# Patient Record
Sex: Male | Born: 1940 | Race: White | Hispanic: No | Marital: Married | Smoking: Never smoker
Health system: Southern US, Community
[De-identification: ages and names within clinical notes are randomized; demographics above are authoritative.]

## PROBLEM LIST (undated history)

## (undated) DIAGNOSIS — E785 Hyperlipidemia, unspecified: Secondary | ICD-10-CM

## (undated) DIAGNOSIS — D696 Thrombocytopenia, unspecified: Secondary | ICD-10-CM

## (undated) DIAGNOSIS — I2510 Atherosclerotic heart disease of native coronary artery without angina pectoris: Secondary | ICD-10-CM

## (undated) DIAGNOSIS — M722 Plantar fascial fibromatosis: Secondary | ICD-10-CM

## (undated) DIAGNOSIS — Z8739 Personal history of other diseases of the musculoskeletal system and connective tissue: Secondary | ICD-10-CM

## (undated) DIAGNOSIS — I10 Essential (primary) hypertension: Secondary | ICD-10-CM

## (undated) HISTORY — DX: Atherosclerotic heart disease of native coronary artery without angina pectoris: I25.10

## (undated) HISTORY — DX: Hyperlipidemia, unspecified: E78.5

## (undated) HISTORY — DX: Plantar fascial fibromatosis: M72.2

## (undated) HISTORY — DX: Personal history of other diseases of the musculoskeletal system and connective tissue: Z87.39

## (undated) HISTORY — DX: Essential (primary) hypertension: I10

---

## 1989-03-06 HISTORY — PX: CORONARY ARTERY BYPASS GRAFT: SHX141

## 2004-04-25 ENCOUNTER — Encounter: Payer: Self-pay | Admitting: Cardiology

## 2009-08-16 ENCOUNTER — Encounter: Payer: Self-pay | Admitting: Cardiology

## 2011-06-05 HISTORY — PX: CARDIAC CATHETERIZATION: SHX172

## 2011-06-09 ENCOUNTER — Encounter: Payer: Self-pay | Admitting: *Deleted

## 2011-06-09 DIAGNOSIS — M722 Plantar fascial fibromatosis: Secondary | ICD-10-CM | POA: Insufficient documentation

## 2011-06-09 DIAGNOSIS — Z8739 Personal history of other diseases of the musculoskeletal system and connective tissue: Secondary | ICD-10-CM | POA: Insufficient documentation

## 2011-07-02 ENCOUNTER — Encounter (HOSPITAL_COMMUNITY): Payer: Self-pay | Admitting: Emergency Medicine

## 2011-07-02 ENCOUNTER — Inpatient Hospital Stay (HOSPITAL_COMMUNITY)
Admission: EM | Admit: 2011-07-02 | Discharge: 2011-07-04 | DRG: 247 | Disposition: A | Discharge status: Home / Self Care | Payer: Medicare Other | Attending: Cardiology | Admitting: Cardiology

## 2011-07-02 ENCOUNTER — Emergency Department (HOSPITAL_COMMUNITY): Payer: Medicare Other

## 2011-07-02 DIAGNOSIS — Z951 Presence of aortocoronary bypass graft: Secondary | ICD-10-CM

## 2011-07-02 DIAGNOSIS — I2510 Atherosclerotic heart disease of native coronary artery without angina pectoris: Principal | ICD-10-CM | POA: Diagnosis present

## 2011-07-02 DIAGNOSIS — M722 Plantar fascial fibromatosis: Secondary | ICD-10-CM | POA: Diagnosis present

## 2011-07-02 DIAGNOSIS — I10 Essential (primary) hypertension: Secondary | ICD-10-CM | POA: Diagnosis present

## 2011-07-02 DIAGNOSIS — I200 Unstable angina: Secondary | ICD-10-CM | POA: Diagnosis present

## 2011-07-02 DIAGNOSIS — E785 Hyperlipidemia, unspecified: Secondary | ICD-10-CM | POA: Diagnosis present

## 2011-07-02 DIAGNOSIS — M109 Gout, unspecified: Secondary | ICD-10-CM | POA: Diagnosis present

## 2011-07-02 DIAGNOSIS — Z79899 Other long term (current) drug therapy: Secondary | ICD-10-CM

## 2011-07-02 LAB — CBC
MCH: 33 pg (ref 26.0–34.0)
MCV: 88.2 fL (ref 78.0–100.0)
Platelets: 121 10*3/uL — ABNORMAL LOW (ref 150–400)
RDW: 13.7 % (ref 11.5–15.5)

## 2011-07-02 LAB — POCT I-STAT, CHEM 8
Calcium, Ion: 1.24 mmol/L (ref 1.12–1.32)
Creatinine, Ser: 1.1 mg/dL (ref 0.50–1.35)
Glucose, Bld: 102 mg/dL — ABNORMAL HIGH (ref 70–99)
Hemoglobin: 15 g/dL (ref 13.0–17.0)
TCO2: 26 mmol/L (ref 0–100)

## 2011-07-02 LAB — DIFFERENTIAL
Basophils Absolute: 0 10*3/uL (ref 0.0–0.1)
Basophils Relative: 0 % (ref 0–1)
Eosinophils Absolute: 0.1 10*3/uL (ref 0.0–0.7)
Eosinophils Relative: 1 % (ref 0–5)
Neutrophils Relative %: 61 % (ref 43–77)

## 2011-07-02 LAB — PRO B NATRIURETIC PEPTIDE: Pro B Natriuretic peptide (BNP): 127.9 pg/mL — ABNORMAL HIGH (ref 0–125)

## 2011-07-02 MED ORDER — NITROGLYCERIN 2 % TD OINT
1.0000 [in_us] | TOPICAL_OINTMENT | Freq: Once | TRANSDERMAL | Status: AC
  Administered 2011-07-02: 1 [in_us] via TOPICAL
  Filled 2011-07-02: qty 1

## 2011-07-02 MED ORDER — HEPARIN (PORCINE) IN NACL 100-0.45 UNIT/ML-% IJ SOLN
1200.0000 [IU]/h | INTRAMUSCULAR | Status: DC
  Administered 2011-07-02: 1000 [IU]/h via INTRAVENOUS
  Filled 2011-07-02 (×2): qty 250

## 2011-07-02 MED ORDER — HEPARIN BOLUS VIA INFUSION
4000.0000 [IU] | Freq: Once | INTRAVENOUS | Status: AC
  Administered 2011-07-02: 4000 [IU] via INTRAVENOUS

## 2011-07-02 MED ORDER — CLOPIDOGREL BISULFATE 300 MG PO TABS
600.0000 mg | ORAL_TABLET | Freq: Once | ORAL | Status: AC
  Administered 2011-07-02: 600 mg via ORAL
  Filled 2011-07-02: qty 2

## 2011-07-02 NOTE — ED Provider Notes (Signed)
History     CSN: [id number]  Arrival date & time 07/02/11  2121   First MD Initiated Contact with Patient 07/02/11 2131      Chief Complaint  Patient presents with  . Chest Pain    (Consider location/radiation/quality/duration/timing/severity/associated sxs/prior treatment) Patient is a 71 y.o. male presenting with chest pain. The history is provided by the patient. No language interpreter was used.  Chest Pain The chest pain began 2 days ago. Chest pain occurs intermittently. The chest pain is worsening. The pain is associated with exertion. The severity of the pain is moderate. The quality of the pain is described as pressure-like, similar to previous episodes and heavy. The pain does not radiate. Chest pain is worsened by exertion. Primary symptoms include shortness of breath and nausea. Pertinent negatives for primary symptoms include no fever, no fatigue, no cough, no palpitations, no abdominal pain, no vomiting and no dizziness.  The shortness of breath began 2 days ago. The shortness of breath developed with exertion. The shortness of breath is moderate.  Pertinent negatives for associated symptoms include no diaphoresis, no lower extremity edema, no near-syncope, no numbness, no orthopnea, no paroxysmal nocturnal dyspnea and no weakness. He tried nitroglycerin and aspirin for the symptoms. Risk factors include male gender.  His past medical history is significant for CAD, hyperlipidemia, hypertension and MI.  Procedure history is positive for cardiac catheterization.     Past Medical History  Diagnosis Date  . Coronary artery disease   . Hypertension   . Hyperlipidemia   . Bilateral plantar fasciitis   . H/O: gout     Past Surgical History  Procedure Date  . Coronary artery bypass graft 1991    severe 90% ostial LAD lesion  . Cardiac catheterization     History reviewed. No pertinent family history.  History  Substance Use Topics  . Smoking status: Never Smoker     . Smokeless tobacco: Not on file  . Alcohol Use: Yes     Occassional Use      Review of Systems  Constitutional: Negative for fever, chills, diaphoresis, activity change, appetite change and fatigue.  HENT: Negative for congestion, sore throat, rhinorrhea, neck pain and neck stiffness.   Respiratory: Positive for shortness of breath. Negative for cough.   Cardiovascular: Positive for chest pain. Negative for palpitations, orthopnea and near-syncope.  Gastrointestinal: Positive for nausea. Negative for vomiting and abdominal pain.  Genitourinary: Negative for dysuria, urgency, frequency and flank pain.  Musculoskeletal: Negative for myalgias, back pain and arthralgias.  Neurological: Negative for dizziness, weakness, light-headedness, numbness and headaches.  All other systems reviewed and are negative.    Allergies  Review of patient's allergies indicates no known allergies.  Home Medications   Current Outpatient Rx  Name Route Sig Dispense Refill  . ALLOPURINOL 100 MG PO TABS Oral Take 100 mg by mouth 2 (two) times daily.    . ASPIRIN 81 MG PO TABS Oral Take 81 mg by mouth daily.    Marland Kitchen BENAZEPRIL HCL 10 MG PO TABS Oral Take 10 mg by mouth daily.    Marland Kitchen VITAMIN D3 2000 UNITS PO TABS Oral Take by mouth daily.    . OMEGA-3 FATTY ACIDS 1000 MG PO CAPS Oral Take 1 g by mouth daily.    Marland Kitchen FLAX SEED OIL PO Oral Take by mouth. Taking 1300 daily    . ONE-DAILY MULTI VITAMINS PO TABS Oral Take 1 tablet by mouth daily.    . TURMERIC PO Oral Take  by mouth daily.      BP 132/89  Pulse 70  Resp 16  SpO2 98%  Physical Exam  Nursing note and vitals reviewed. Constitutional: He is oriented to person, place, and time. He appears well-developed and well-nourished.       Uncomfortable in appearance  HENT:  Head: Normocephalic and atraumatic.  Mouth/Throat: Oropharynx is clear and moist. No oropharyngeal exudate.  Eyes: Conjunctivae and EOM are normal. Pupils are equal, round, and reactive  to light.  Neck: Normal range of motion. Neck supple.  Cardiovascular: Normal rate, regular rhythm, normal heart sounds and intact distal pulses.  Exam reveals no gallop and no friction rub.   No murmur heard. Pulmonary/Chest: Effort normal and breath sounds normal. No respiratory distress. He exhibits no tenderness.  Abdominal: Soft. Bowel sounds are normal. There is no tenderness. There is no rebound and no guarding.  Musculoskeletal: Normal range of motion. He exhibits no edema and no tenderness.  Neurological: He is alert and oriented to person, place, and time. No cranial nerve deficit.  Skin: Skin is warm and dry.    ED Course  Procedures (including critical care time)  CRITICAL CARE Performed by: Dayton Bailiff   Total critical care time: 30 min  Critical care time was exclusive of separately billable procedures and treating other patients.  Critical care was necessary to treat or prevent imminent or life-threatening deterioration.  Critical care was time spent personally by me on the following activities: development of treatment plan with patient and/or surrogate as well as nursing, discussions with consultants, evaluation of patient's response to treatment, examination of patient, obtaining history from patient or surrogate, ordering and performing treatments and interventions, ordering and review of laboratory studies, ordering and review of radiographic studies, pulse oximetry and re-evaluation of patient's condition.   Date: 07/02/2011  Rate: 80  Rhythm: normal sinus rhythm, premature ventricular contractions (PVC) and ventricular bigeminy  QRS Axis: normal  Intervals: normal  ST/T Wave abnormalities: st depressions anterolaterally  Conduction Disutrbances:none  Narrative Interpretation:   Old EKG Reviewed: changes noted  Labs Reviewed  CBC - Abnormal; Notable for the following:    MCHC 37.4 (*)    Platelets 121 (*)    All other components within normal limits  PRO  B NATRIURETIC PEPTIDE - Abnormal; Notable for the following:    Pro B Natriuretic peptide (BNP) 127.9 (*)    All other components within normal limits  POCT I-STAT, CHEM 8 - Abnormal; Notable for the following:    Glucose, Bld 102 (*)    All other components within normal limits  DIFFERENTIAL  PROTIME-INR  POCT I-STAT TROPONIN I   Dg Chest Portable 1 View  07/02/2011  *RADIOLOGY REPORT*  Clinical Data: Chest pain, tightness.  PORTABLE CHEST - 1 VIEW  Comparison: None.  Findings: Heart size upper normal to mildly large status post median sternotomy and CABG.  Interstitial prominence is nonspecific, may reflect senescent changes.  No focal consolidation.  No pleural effusion or pneumothorax.  No acute osseous abnormality.  IMPRESSION: Interstitial prominence without focal consolidation.  Original Report Authenticated By: Waneta Martins, M.D.     1. Unstable angina       MDM  Concerning story for UA.  Discussed with cardiology after trop and ecg performed.  No cxr changes.  Will place on heparin and administer plavix and will admit to cardiology service for UA.  Additional etiologies chest pain considered such as PE and dissection however ACS is the most likely  cause given patient hx and presenting sx        Dayton Bailiff, MD 07/02/11 2249

## 2011-07-02 NOTE — ED Notes (Signed)
Xray and family at BS 

## 2011-07-02 NOTE — ED Notes (Signed)
Alert, NAD, calm, interactive, "CP described as discomfort & indigestion has moved from chest to abd", still 1/10, vague and mild, family x2 at [facility].

## 2011-07-02 NOTE — ED Notes (Signed)
Per EMS, patient complaining of shortness of breath for the last two weeks.  Patient began to have chest pain on Friday; describes chest pain as a "tightness" across the cente rof his chest.  Patient still having chest pain today, but states that it is not as severe as Friday.  Patient has had a bypass in the past (patient states that the pain feels similar to his pain before he had the bypass for a 97% blocked artery).  Patient took 324 mg ASA upon EMS arrival; EMS gave patient 1 nitro.  20 GA left AC IV started by EMS.

## 2011-07-03 ENCOUNTER — Encounter (HOSPITAL_COMMUNITY): Payer: Self-pay | Admitting: *Deleted

## 2011-07-03 ENCOUNTER — Encounter (HOSPITAL_COMMUNITY)
Admission: EM | Disposition: A | Discharge status: Home / Self Care | Payer: Self-pay | Source: Home / Self Care | Attending: Cardiology

## 2011-07-03 DIAGNOSIS — I200 Unstable angina: Secondary | ICD-10-CM

## 2011-07-03 DIAGNOSIS — I2510 Atherosclerotic heart disease of native coronary artery without angina pectoris: Secondary | ICD-10-CM

## 2011-07-03 HISTORY — PX: LEFT HEART CATHETERIZATION WITH CORONARY/GRAFT ANGIOGRAM: SHX5450

## 2011-07-03 LAB — CBC
HCT: 45.5 % (ref 39.0–52.0)
HCT: 45.4 % (ref 39.0–52.0)
MCH: 31.2 pg (ref 26.0–34.0)
MCV: 89.9 fL (ref 78.0–100.0)
MCV: 89.9 fL (ref 78.0–100.0)
Platelets: 114 10*3/uL — ABNORMAL LOW (ref 150–400)
RBC: 5.06 MIL/uL (ref 4.22–5.81)
RBC: 5.05 MIL/uL (ref 4.22–5.81)
RDW: 13.9 % (ref 11.5–15.5)
RDW: 13.7 % (ref 11.5–15.5)
WBC: 6.5 10*3/uL (ref 4.0–10.5)
WBC: 6.3 10*3/uL (ref 4.0–10.5)

## 2011-07-03 LAB — BASIC METABOLIC PANEL
BUN: 14 mg/dL (ref 6–23)
Chloride: 104 mEq/L (ref 96–112)
GFR calc Af Amer: >90 mL/min (ref >90)
GFR calc non Af Amer: 82 mL/min — ABNORMAL LOW (ref >90)
Glucose, Bld: 95 mg/dL (ref 70–99)
Potassium: 4 mEq/L (ref 3.5–5.1)
Sodium: 141 mEq/L (ref 135–145)

## 2011-07-03 LAB — CARDIAC PANEL(CRET KIN+CKTOT+MB+TROPI)
CK, MB: 1.8 ng/mL (ref 0.3–4.0)
CK, MB: 1.5 ng/mL (ref 0.3–4.0)
Relative Index: INVALID (ref 0.0–2.5)
Troponin I: <0.3 ng/mL (ref <0.30)
Troponin I: <0.3 ng/mL (ref <0.30)

## 2011-07-03 LAB — LIPID PANEL
Cholesterol: 198 mg/dL (ref 0–200)
Total CHOL/HDL Ratio: 5.4 RATIO
Triglycerides: 205 mg/dL — ABNORMAL HIGH (ref <150)

## 2011-07-03 LAB — POCT ACTIVATED CLOTTING TIME: Activated Clotting Time: 358 seconds

## 2011-07-03 LAB — PROTIME-INR
INR: 0.92 (ref 0.00–1.49)
INR: 0.92 (ref 0.00–1.49)

## 2011-07-03 LAB — HEMOGLOBIN A1C: Mean Plasma Glucose: 97 mg/dL (ref <117)

## 2011-07-03 SURGERY — LEFT HEART CATHETERIZATION WITH CORONARY/GRAFT ANGIOGRAM
Anesthesia: LOCAL

## 2011-07-03 MED ORDER — SODIUM CHLORIDE 0.9 % IV SOLN
INTRAVENOUS | Status: AC
  Administered 2011-07-03: 16:00:00 via INTRAVENOUS

## 2011-07-03 MED ORDER — MORPHINE SULFATE 2 MG/ML IJ SOLN: 1.0000 mg | INTRAMUSCULAR | Status: DC | PRN

## 2011-07-03 MED ORDER — NITROGLYCERIN IN D5W 200-5 MCG/ML-% IV SOLN
5.0000 ug/min | INTRAVENOUS | Status: DC
  Administered 2011-07-03: 5 ug/min via INTRAVENOUS

## 2011-07-03 MED ORDER — ASPIRIN 81 MG PO CHEW
81.0000 mg | CHEWABLE_TABLET | Freq: Every day | ORAL | Status: DC
  Administered 2011-07-04: 81 mg via ORAL
  Filled 2011-07-03: qty 1

## 2011-07-03 MED ORDER — SODIUM CHLORIDE 0.9 % IV SOLN
0.1500 mg/kg/h | INTRAVENOUS | Status: DC
  Filled 2011-07-03: qty 250

## 2011-07-03 MED ORDER — ACETAMINOPHEN 325 MG PO TABS: 650.0000 mg | ORAL_TABLET | ORAL | Status: DC | PRN

## 2011-07-03 MED ORDER — SODIUM CHLORIDE 0.9 % IV SOLN: 1.0000 mL/kg/h | INTRAVENOUS | Status: DC

## 2011-07-03 MED ORDER — NITROGLYCERIN IN D5W 200-5 MCG/ML-% IV SOLN
INTRAVENOUS | Status: AC
  Filled 2011-07-03: qty 250

## 2011-07-03 MED ORDER — SODIUM CHLORIDE 0.9 % IJ SOLN: 3.0000 mL | INTRAMUSCULAR | Status: DC | PRN

## 2011-07-03 MED ORDER — ASPIRIN 81 MG PO CHEW
324.0000 mg | CHEWABLE_TABLET | ORAL | Status: AC
  Administered 2011-07-03: 324 mg via ORAL
  Filled 2011-07-03: qty 4

## 2011-07-03 MED ORDER — LIDOCAINE HCL (PF) 1 % IJ SOLN
INTRAMUSCULAR | Status: AC
  Filled 2011-07-03: qty 30

## 2011-07-03 MED ORDER — MIDAZOLAM HCL 2 MG/2ML IJ SOLN
INTRAMUSCULAR | Status: AC
  Filled 2011-07-03: qty 2

## 2011-07-03 MED ORDER — BENAZEPRIL HCL 10 MG PO TABS
10.0000 mg | ORAL_TABLET | Freq: Every day | ORAL | Status: DC
  Administered 2011-07-03 – 2011-07-04 (×2): 10 mg via ORAL
  Filled 2011-07-03 (×2): qty 1

## 2011-07-03 MED ORDER — CLOPIDOGREL BISULFATE 75 MG PO TABS
75.0000 mg | ORAL_TABLET | Freq: Every day | ORAL | Status: DC
  Administered 2011-07-03 – 2011-07-04 (×2): 75 mg via ORAL
  Filled 2011-07-03 (×3): qty 1

## 2011-07-03 MED ORDER — SODIUM CHLORIDE 0.9 % IJ SOLN: 3.0000 mL | Freq: Two times a day (BID) | INTRAMUSCULAR | Status: DC

## 2011-07-03 MED ORDER — NITROGLYCERIN 0.2 MG/ML ON CALL CATH LAB
INTRAVENOUS | Status: AC
  Filled 2011-07-03: qty 1

## 2011-07-03 MED ORDER — FENTANYL CITRATE 0.05 MG/ML IJ SOLN
INTRAMUSCULAR | Status: AC
  Filled 2011-07-03: qty 2

## 2011-07-03 MED ORDER — BIVALIRUDIN 250 MG IV SOLR
INTRAVENOUS | Status: AC
  Filled 2011-07-03: qty 250

## 2011-07-03 MED ORDER — ASPIRIN EC 325 MG PO TBEC: 325.0000 mg | DELAYED_RELEASE_TABLET | Freq: Every day | ORAL | Status: DC

## 2011-07-03 MED ORDER — ASPIRIN 300 MG RE SUPP
300.0000 mg | RECTAL | Status: DC
  Filled 2011-07-03: qty 1

## 2011-07-03 MED ORDER — ONDANSETRON HCL 4 MG/2ML IJ SOLN: 4.0000 mg | Freq: Four times a day (QID) | INTRAMUSCULAR | Status: DC | PRN

## 2011-07-03 MED ORDER — METOPROLOL TARTRATE 25 MG PO TABS
ORAL_TABLET | ORAL | Status: AC
  Filled 2011-07-03: qty 1

## 2011-07-03 MED ORDER — METOPROLOL TARTRATE 25 MG PO TABS
25.0000 mg | ORAL_TABLET | Freq: Two times a day (BID) | ORAL | Status: DC
  Administered 2011-07-03 – 2011-07-04 (×4): 25 mg via ORAL
  Filled 2011-07-03 (×5): qty 1

## 2011-07-03 MED ORDER — SODIUM CHLORIDE 0.9 % IV SOLN: 250.0000 mL | INTRAVENOUS | Status: DC | PRN

## 2011-07-03 MED ORDER — ALLOPURINOL 100 MG PO TABS
100.0000 mg | ORAL_TABLET | Freq: Two times a day (BID) | ORAL | Status: DC
  Administered 2011-07-03 – 2011-07-04 (×3): 100 mg via ORAL
  Filled 2011-07-03 (×5): qty 1

## 2011-07-03 MED ORDER — HEPARIN (PORCINE) IN NACL 2-0.9 UNIT/ML-% IJ SOLN
INTRAMUSCULAR | Status: AC
  Filled 2011-07-03: qty 2000

## 2011-07-03 MED ORDER — SODIUM CHLORIDE 0.9 % IV SOLN
INTRAVENOUS | Status: DC
  Administered 2011-07-03: 01:00:00 via INTRAVENOUS

## 2011-07-03 MED ORDER — SODIUM CHLORIDE 0.9 % IJ SOLN
3.0000 mL | Freq: Two times a day (BID) | INTRAMUSCULAR | Status: DC
  Administered 2011-07-03: 3 mL via INTRAVENOUS

## 2011-07-03 MED ORDER — NITROGLYCERIN 0.4 MG SL SUBL: 0.4000 mg | SUBLINGUAL_TABLET | SUBLINGUAL | Status: DC | PRN

## 2011-07-03 MED ORDER — ASPIRIN 81 MG PO CHEW: 324.0000 mg | CHEWABLE_TABLET | ORAL | Status: DC

## 2011-07-03 NOTE — Progress Notes (Signed)
ANTICOAGULATION CONSULT NOTE - Follow Up Consult  Pharmacy Consult for heparin Indication: USAP  Labs:  Basename 07/03/11 0635 07/03/11 0031 07/02/11 2208 07/02/11 2150 07/02/11 2136  HGB 15.8 -- 15.0 -- --  HCT 45.5 -- 44.0 -- 43.3  PLT 122* -- -- -- 121*  APTT -- 115* -- -- --  LABPROT -- 12.6 -- 11.8 --  INR -- 0.92 -- 0.85 --  HEPARINUNFRC 0.21* -- -- -- --  CREATININE -- -- 1.10 -- --  CKTOTAL -- 48 -- -- --  CKMB -- 1.8 -- -- --  TROPONINI -- <0.30 -- -- --    Assessment: 71yo male subtherapeutic on heparin with initial dosing for USAP.  Goal of Therapy:  Heparin level 0.3-0.7 units/ml   Plan:  Will increase heparin gtt by ~2 units/kg/hr to 1200 units/hr and check level in 6hr.  Colleen Can PharmD BCPS 07/03/2011,7:39 AM

## 2011-07-03 NOTE — ED Notes (Signed)
Floor 2000 unable to take report

## 2011-07-03 NOTE — Interval H&P Note (Signed)
History and Physical Interval Note:  07/03/2011 10:41 AM  Devon [name]  has presented today for surgery, with the diagnosis of cp  The various methods of treatment have been discussed with the patient. After consideration of risks, benefits and other options for treatment, the patient has consented to  Procedure(s) (LRB): LEFT HEART CATHETERIZATION WITH CORONARY/GRAFT ANGIOGRAM (N/A) as a surgical intervention .  The patients' history has been reviewed, patient examined, no change in status, stable for surgery.  I have reviewed the patients' chart and labs.  Questions were answered to the patient's satisfaction.  Op report reviewed:  Single IMA to the LAD.       Shawnie Pons

## 2011-07-03 NOTE — Progress Notes (Signed)
Subjective:  Patient having mild chest tightness again this am. Will start O2 and IV NTG. EKG shows new anterior T wave changes. Enzymes neg.  Objective:  Vital Signs in the last 24 hours: Temp:  [97.4 F (36.3 C)-97.8 F (36.6 C)] 97.5 F (36.4 C) (04/29 0519) Pulse Rate:  [52-72] 52  (04/29 0519) Resp:  [10-18] 12  (04/29 0519) BP: (118-189)/(69-98) 148/81 mmHg (04/29 0519) SpO2:  [97 %-100 %] 97 % (04/29 0519) Weight:  [86.909 kg (191 lb 9.6 oz)] 86.909 kg (191 lb 9.6 oz) (04/29 0147)  Intake/Output from previous day: 04/28 0701 - 04/29 0700 In: -  Out: 200 [Urine:200] Intake/Output from this shift:       . allopurinol  100 mg Oral BID  . aspirin  324 mg Oral NOW   Or  . aspirin  300 mg Rectal NOW  . aspirin EC  325 mg Oral Daily  . benazepril  10 mg Oral Daily  . clopidogrel  600 mg Oral Once  . clopidogrel  75 mg Oral Q breakfast  . heparin  4,000 Units Intravenous Once  . metoprolol tartrate  25 mg Oral BID  . nitroGLYCERIN  1 inch Topical Once  . nitroGLYCERIN      . sodium chloride  3 mL Intravenous Q12H      . sodium chloride 75 mL/hr at 07/03/11 0054  . heparin 1,200 Units/hr (07/03/11 0743)  . nitroGLYCERIN      Physical Exam: The patient appears to be in mild  distress.  Head and neck exam reveals that the pupils are equal and reactive.  The extraocular movements are full.  There is no scleral icterus.  Mouth and pharynx are benign.  No lymphadenopathy.  No carotid bruits.  The jugular venous pressure is normal.  Thyroid is not enlarged or tender.  Chest is clear to percussion and auscultation.  No rales or rhonchi.  Expansion of the chest is symmetrical.  Heart reveals no abnormal lift or heave.  First and second heart sounds are normal.  There is no murmur gallop rub or click.  The abdomen is soft and nontender.  Bowel sounds are normoactive.  There is no hepatosplenomegaly or mass.  There are no abdominal bruits.  Extremities reveal no  phlebitis or edema.  Pedal pulses are good.  There is no cyanosis or clubbing.  Neurologic exam is normal strength and no lateralizing weakness.  No sensory deficits.  Integument reveals no rash  Lab Results:  Basename 07/03/11 0635 07/02/11 2208 07/02/11 2136  WBC 6.5 -- 8.2  HGB 15.8 15.0 --  PLT 122* -- 121*    Basename 07/02/11 2208  NA 140  K 3.6  CL 108  CO2 --  GLUCOSE 102*  BUN 21  CREATININE 1.10    Basename 07/03/11 0031  TROPONINI <0.30   Hepatic Function Panel No results found for this basename: PROT,ALBUMIN,AST,ALT,ALKPHOS,BILITOT,BILIDIR,IBILI in the last 72 hours  Basename 07/03/11 0635  CHOL 198   No results found for this basename: PROTIME in the last 72 hours  Imaging: Dg Chest Portable 1 View  07/02/2011  *RADIOLOGY REPORT*  Clinical Data: Chest pain, tightness.  PORTABLE CHEST - 1 VIEW  Comparison: None.  Findings: Heart size upper normal to mildly large status post median sternotomy and CABG.  Interstitial prominence is nonspecific, may reflect senescent changes.  No focal consolidation.  No pleural effusion or pneumothorax.  No acute osseous abnormality.  IMPRESSION: Interstitial prominence without focal consolidation.  Original Report  Authenticated By: Waneta Martins, M.D.    Cardiac Studies: EKG shows new anterior T wave abnormalities. Assessment/Plan:  1. Unstable angina pectoris 2  CAD with remote CABG 3. Essential hypertension.  Plan: Cath this am    LOS: 1 day    Cassell Clement 07/03/2011, 7:56 AM

## 2011-07-03 NOTE — Progress Notes (Signed)
ANTICOAGULATION CONSULT NOTE - Initial Consult  Pharmacy Consult for heparin Indication: USAP  No Known Allergies  Patient Measurements: Height: 6\' 2"  (188 cm) Weight: 191 lb 9.6 oz (86.909 kg) IBW/kg (Calculated) : 82.2   Vital Signs: Temp: 97.4 F (36.3 C) (04/29 0147) Temp src: Oral (04/29 0147) BP: 153/76 mmHg (04/29 0147) Pulse Rate: 57  (04/29 0147)  Labs:  Alvira Philips 07/03/11 0031 07/02/11 2208 07/02/11 2150 07/02/11 2136  HGB -- 15.0 -- 16.2  HCT -- 44.0 -- 43.3  PLT -- -- -- 121*  APTT 115* -- -- --  LABPROT 12.6 -- 11.8 --  INR 0.92 -- 0.85 --  HEPARINUNFRC -- -- -- --  CREATININE -- 1.10 -- --  CKTOTAL 48 -- -- --  CKMB 1.8 -- -- --  TROPONINI <0.30 -- -- --   Estimated Creatinine Clearance: 69.5 ml/min (by C-G formula based on Cr of 1.1).  Medical History: Past Medical History  Diagnosis Date  . Coronary artery disease   . Hypertension   . Hyperlipidemia   . Bilateral plantar fasciitis   . H/O: gout     Medications:  Prescriptions prior to admission  Medication Sig Dispense Refill  . allopurinol (ZYLOPRIM) 100 MG tablet Take 100 mg by mouth 2 (two) times daily.      . benazepril (LOTENSIN) 10 MG tablet Take 10 mg by mouth daily.      . Cholecalciferol (VITAMIN D3) 2000 UNITS TABS Take by mouth daily.      . fish oil-omega-3 fatty acids 1000 MG capsule Take 1 g by mouth daily.      . Flaxseed, Linseed, (FLAX SEED OIL PO) Take by mouth. Taking 1300 daily      . Multiple Vitamin (MULTIVITAMIN) tablet Take 1 tablet by mouth daily.      . TURMERIC PO Take by mouth daily.       Scheduled:    . allopurinol  100 mg Oral BID  . aspirin  324 mg Oral NOW   Or  . aspirin  300 mg Rectal NOW  . aspirin EC  325 mg Oral Daily  . benazepril  10 mg Oral Daily  . clopidogrel  600 mg Oral Once  . clopidogrel  75 mg Oral Q breakfast  . heparin  4,000 Units Intravenous Once  . metoprolol tartrate  25 mg Oral BID  . nitroGLYCERIN  1 inch Topical Once  . sodium  chloride  3 mL Intravenous Q12H    Assessment: 71yo male with h/o CABG >28yr ago and normal stress test two years ago c/o CP relieved with NTG, to begin heparin for USAP while awaiting cath for suspected obstructive lesions.  Goal of Therapy:  Heparin level 0.3-0.7 units/ml   Plan:  Heparin was started by EDMD with heparin bolus of 4000 units and gtt at 1000 units/hr; will continue and monitor heparin levels and CBC.  Colleen Can PharmD BCPS 07/03/2011,2:16 AM

## 2011-07-03 NOTE — ED Notes (Signed)
Card MD at BS 

## 2011-07-03 NOTE — Progress Notes (Signed)
   CARE MANAGEMENT NOTE 07/03/2011  Patient:  Devon [name], Devon [name]   Account Number:  [id number]  Date Initiated:  07/03/2011  Documentation initiated by:  GRAVES-BIGELOW,Kristan Brummitt  Subjective/Objective Assessment:   Pt admitted with cp. S/p LHC/ PCI. Plan for home when stable on plavix.     Action/Plan:   Anticipated DC Date:  07/04/2011   Anticipated DC Plan:  HOME/SELF CARE      DC Planning Services  CM consult      Choice offered to / List presented to:             Status of service:  Completed, signed off Medicare Important Message given?   (If response is "NO", the following Medicare IM given date fields will be blank) Date Medicare IM given:   Date Additional Medicare IM given:    Discharge Disposition:  HOME/SELF CARE  Per UR Regulation:    If discussed at Long Length of Stay Meetings, dates discussed:    Comments:  07-03-11 1552 Tomi Bamberger, RN,BSN [phone] CM will continue to monitor for additional needs.

## 2011-07-03 NOTE — H&P (View-Only) (Signed)
 Subjective:  Patient having mild chest tightness again this am. Will start O2 and IV NTG. EKG shows new anterior T wave changes. Enzymes neg.  Objective:  Vital Signs in the last 24 hours: Temp:  [97.4 F (36.3 C)-97.8 F (36.6 C)] 97.5 F (36.4 C) (04/29 0519) Pulse Rate:  [52-72] 52  (04/29 0519) Resp:  [10-18] 12  (04/29 0519) BP: (118-189)/(69-98) 148/81 mmHg (04/29 0519) SpO2:  [97 %-100 %] 97 % (04/29 0519) Weight:  [86.909 kg (191 lb 9.6 oz)] 86.909 kg (191 lb 9.6 oz) (04/29 0147)  Intake/Output from previous day: 04/28 0701 - 04/29 0700 In: -  Out: 200 [Urine:200] Intake/Output from this shift:       . allopurinol  100 mg Oral BID  . aspirin  324 mg Oral NOW   Or  . aspirin  300 mg Rectal NOW  . aspirin EC  325 mg Oral Daily  . benazepril  10 mg Oral Daily  . clopidogrel  600 mg Oral Once  . clopidogrel  75 mg Oral Q breakfast  . heparin  4,000 Units Intravenous Once  . metoprolol tartrate  25 mg Oral BID  . nitroGLYCERIN  1 inch Topical Once  . nitroGLYCERIN      . sodium chloride  3 mL Intravenous Q12H      . sodium chloride 75 mL/hr at 07/03/11 0054  . heparin 1,200 Units/hr (07/03/11 0743)  . nitroGLYCERIN      Physical Exam: The patient appears to be in mild  distress.  Head and neck exam reveals that the pupils are equal and reactive.  The extraocular movements are full.  There is no scleral icterus.  Mouth and pharynx are benign.  No lymphadenopathy.  No carotid bruits.  The jugular venous pressure is normal.  Thyroid is not enlarged or tender.  Chest is clear to percussion and auscultation.  No rales or rhonchi.  Expansion of the chest is symmetrical.  Heart reveals no abnormal lift or heave.  First and second heart sounds are normal.  There is no murmur gallop rub or click.  The abdomen is soft and nontender.  Bowel sounds are normoactive.  There is no hepatosplenomegaly or mass.  There are no abdominal bruits.  Extremities reveal no  phlebitis or edema.  Pedal pulses are good.  There is no cyanosis or clubbing.  Neurologic exam is normal strength and no lateralizing weakness.  No sensory deficits.  Integument reveals no rash  Lab Results:  Basename 07/03/11 0635 07/02/11 2208 07/02/11 2136  WBC 6.5 -- 8.2  HGB 15.8 15.0 --  PLT 122* -- 121*    Basename 07/02/11 2208  NA 140  K 3.6  CL 108  CO2 --  GLUCOSE 102*  BUN 21  CREATININE 1.10    Basename 07/03/11 0031  TROPONINI <0.30   Hepatic Function Panel No results found for this basename: PROT,ALBUMIN,AST,ALT,ALKPHOS,BILITOT,BILIDIR,IBILI in the last 72 hours  Basename 07/03/11 0635  CHOL 198   No results found for this basename: PROTIME in the last 72 hours  Imaging: Dg Chest Portable 1 View  07/02/2011  *RADIOLOGY REPORT*  Clinical Data: Chest pain, tightness.  PORTABLE CHEST - 1 VIEW  Comparison: None.  Findings: Heart size upper normal to mildly large status post median sternotomy and CABG.  Interstitial prominence is nonspecific, may reflect senescent changes.  No focal consolidation.  No pleural effusion or pneumothorax.  No acute osseous abnormality.  IMPRESSION: Interstitial prominence without focal consolidation.  Original Report   Authenticated By: ANDREW J. DELGAIZO, M.D.    Cardiac Studies: EKG shows new anterior T wave abnormalities. Assessment/Plan:  1. Unstable angina pectoris 2  CAD with remote CABG 3. Essential hypertension.  Plan: Cath this am    LOS: 1 day    Ronetta Molla 07/03/2011, 7:56 AM    

## 2011-07-03 NOTE — H&P (Signed)
Primary Cardiologist:  Brackbill  CC: Chest pain.   History of Present Illness:  71 y.o. male with a past medical history significant for coronary artery disease status post coronary artery bypass grafting he presents for evaluation of exertional fatigue and 2 episodes of chest pain. The patient is status post coronary artery bypass grafting greater than 20 years ago. Since that time, he has had no further cardiac interventions. He did undergo a stress test 2 years ago which was reportedly normal.  He reports that over the past 3-4 weeks he has had progressive exertional fatigue. He does go to [employer], and he has been exercising and states that he becomes more tired easily. This past Friday night he woke at midnight with a sensation of an "elephant sitting on his chest." This lasted approximately 6 hours. The patient took nitroglycerin the morning and his pain went away.  He felt well throughout the day on Saturday. Today, on Sunday he had another episode that was severe in nature. There were no associated signs or symptoms. He called EMS and they gave him nitrglycerin and was brought to emergency department where a nitroglycerin patch resolved his symptoms.  The only recent change his medical history is that he has been suffering from plantar fasciitis. He recently started taking tramadol 2 days ago. This corresponds that time that he has been having chest pain  PMH: 1. CAD s/p CABG 22 years ago (unknown anatomy) 2. HTN 3. Gout  SOCIAL HISTORY: The patient denies tobacco use.   FAMILY HISTORY: There is no family history of CAD.   REVIEW OF SYSTEMS: All systems reviewed and are negative except as mentioned above in the HPI.    MEDICATIONS: No current facility-administered medications on file prior to encounter.   Current Outpatient Prescriptions on File Prior to Encounter  Medication Sig Dispense Refill  . allopurinol (ZYLOPRIM) 100 MG tablet Take 100 mg by mouth 2 (two) times daily.      Marland Kitchen  aspirin 81 MG tablet Take 81 mg by mouth daily.      . benazepril (LOTENSIN) 10 MG tablet Take 10 mg by mouth daily.      . Cholecalciferol (VITAMIN D3) 2000 UNITS TABS Take by mouth daily.      . fish oil-omega-3 fatty acids 1000 MG capsule Take 1 g by mouth daily.      . Flaxseed, Linseed, (FLAX SEED OIL PO) Take by mouth. Taking 1300 daily      . Multiple Vitamin (MULTIVITAMIN) tablet Take 1 tablet by mouth daily.      . TURMERIC PO Take by mouth daily.         ALLERGY: No Known Allergies   PHYSICAL EXAMINATION: Filed Vitals:   07/02/11 2345  BP: 135/88  Pulse: 70  Temp:   Resp:      GENERAL: well developed, well nourished, no acute distress EYES: PERRL, EOMI ENT: Oropharynx is clear.  Dentition is within normal limits NECK:  There is no JVD, or carotid bruits. No masses HEART: RRR with no murmurs, rubs, or gallops LUNGS: Clear to auscultation bilaterally ABDOMEN:  +BS, soft, NTND EXTREMITIES:  No clubbing, cyanosis, or edema. PULSES:  Femoral pulses are 2+ bilaterally without bruits.   NEUROLOGIC: AOx3, no focal deficits SKIN: No rashes PSYCH:  Normal judgment and insight, mood is appropriate.  EKG: NSR with PVCs and nonspecific STTW changes.   Results for orders placed during the hospital encounter of 07/02/11 (from the past 24 hour(s))  CBC  Status: Abnormal   Collection Time   07/02/11  9:36 PM      Component Value Range   WBC 8.2  4.0 - 10.5 (K/uL)   RBC 4.91  4.22 - 5.81 (MIL/uL)   Hemoglobin 16.2  13.0 - 17.0 (g/dL)   HCT 16.1  09.6 - 04.5 (%)   MCV 88.2  78.0 - 100.0 (fL)   MCH 33.0  26.0 - 34.0 (pg)   MCHC 37.4 (*) 30.0 - 36.0 (g/dL)   RDW 40.9  81.1 - 91.4 (%)   Platelets 121 (*) 150 - 400 (K/uL)  DIFFERENTIAL     Status: Normal   Collection Time   07/02/11  9:36 PM      Component Value Range   Neutrophils Relative 61  43 - 77 (%)   Neutro Abs 5.0  1.7 - 7.7 (K/uL)   Lymphocytes Relative 29  12 - 46 (%)   Lymphs Abs 2.3  0.7 - 4.0 (K/uL)    Monocytes Relative 9  3 - 12 (%)   Monocytes Absolute 0.7  0.1 - 1.0 (K/uL)   Eosinophils Relative 1  0 - 5 (%)   Eosinophils Absolute 0.1  0.0 - 0.7 (K/uL)   Basophils Relative 0  0 - 1 (%)   Basophils Absolute 0.0  0.0 - 0.1 (K/uL)  PRO B NATRIURETIC PEPTIDE     Status: Abnormal   Collection Time   07/02/11  9:36 PM      Component Value Range   Pro B Natriuretic peptide (BNP) 127.9 (*) 0 - 125 (pg/mL)  PROTIME-INR     Status: Normal   Collection Time   07/02/11  9:50 PM      Component Value Range   Prothrombin Time 11.8  11.6 - 15.2 (seconds)   INR 0.85  0.00 - 1.49   POCT I-STAT TROPONIN I     Status: Normal   Collection Time   07/02/11 10:02 PM      Component Value Range   Troponin i, poc 0.01  0.00 - 0.08 (ng/mL)   Comment 3           POCT I-STAT, CHEM 8     Status: Abnormal   Collection Time   07/02/11 10:08 PM      Component Value Range   Sodium 140  135 - 145 (mEq/L)   Potassium 3.6  3.5 - 5.1 (mEq/L)   Chloride 108  96 - 112 (mEq/L)   BUN 21  6 - 23 (mg/dL)   Creatinine, Ser 7.82  0.50 - 1.35 (mg/dL)   Glucose, Bld 956 (*) 70 - 99 (mg/dL)   Calcium, Ion 2.13  0.86 - 1.32 (mmol/L)   TCO2 26  0 - 100 (mmol/L)   Hemoglobin 15.0  13.0 - 17.0 (g/dL)   HCT 57.8  46.9 - 62.9 (%)   Dg Chest Portable 1 View  07/02/2011  *RADIOLOGY REPORT*  Clinical Data: Chest pain, tightness.  PORTABLE CHEST - 1 VIEW  Comparison: None.  Findings: Heart size upper normal to mildly large status post median sternotomy and CABG.  Interstitial prominence is nonspecific, may reflect senescent changes.  No focal consolidation.  No pleural effusion or pneumothorax.  No acute osseous abnormality.  IMPRESSION: Interstitial prominence without focal consolidation.  Original Report Authenticated By: Waneta Martins, M.D.    ASSESSMENT AND PLAN: 71 year old white male with a past medical history significant for coronary artery disease status post coronary artery bypass grafting who presents for evaluation  of exertional fatigue as well as 2 episodes of chest pain. His clinical syndrome is concerning for unstable angina.  1: Admit to [facility] / Patty Sermons  2: Unstable angina - Currently he is chest pain free. He has received Plavix 600 mg and was started on IV heparin drip in the ED.  We will continued him on aspirin Plavix and heparin drip. He's been intolerant to statins and we will not use this. We will start him on beta blockers. We'll cycle cardiac enzymes. I feel he would benefit from a cardiac catheterization and this should be performed tomorrow.  Based on his history,  suspect that he will have obstructive lesions. However should this demonstrated no obstructive disease, then his symptoms may be related to a side effect of tramadol.  3: HTN - continue lotensin  4: FEN: SLIVF, Electrolytes are stable, NPO after midnight.    5: DVT prophylaxis: N/A as patient on heparin gtt.   Bernestine Amass. Mayford Knife, MD Level 3 H&P

## 2011-07-03 NOTE — CV Procedure (Signed)
  Cardiac Catheterization Procedure Note  Name: Devon [name] MRN: [id number] DOB: Feb 09, 1941  Procedure: Left Heart Cath, Selective Coronary Angiography, LV angiography,  PTCA/Stent of CFX, LIMA angiography  Indication:  Unstable angina   Diagnostic Procedure Details: The right groin was prepped, draped, and anesthetized with 1% lidocaine. Using the modified Seldinger technique, a 5 French sheath was introduced into the right femoral artery. Standard Judkins catheters were used for selective coronary angiography, LIMA angiography,  and left ventriculography. Catheter exchanges were performed over a wire.  The diagnostic procedure was well-tolerated without immediate complications.  PROCEDURAL FINDINGS Hemodynamics: AO 165/88 (119) LV 159/16  Coronary angiography: Coronary dominance: right  Left mainstem: No significant obstructiion  Left anterior descending (LAD): The LAD has about 50% at the bifurcation of the diagonal.  There is competitive flow distally.  The diagonal has about 60% narrowing at its ostium, then about 50% segmental plaque throughout the mid portion.   The LIMA to the distal LAD is widely patent.    Left circumflex (LCx): Has proximal calcium and 20% narrowing.  OM 1 is normal.  Just passed OM1 there is about 30% narrowing.  At the takeoff and just beyond the small OM2, there is an eccentric 80% hazy lesion best seen in the LAO views.  It is partially hidden in the RAO views due to overlap.  The distal vessel is large.    Right coronary artery (RCA): Moderate size vessel with there smaller distal branches  (PDA, PLA 1, PLA 2).  There is 20% mid narrowing.    Left ventriculography: Left ventricular systolic function is abnormal with EF of 50% and hypokinesis of the distal inferior wall  (appears CFX territory).  PCI Procedure Note:  Following the diagnostic procedure, the decision was made to proceed with PCI. I spoke with the family  and the patient.  The sheath was  upsized to a 6 [place]. Weight-based bivalirudin was given for anticoagulation. Once a therapeutic ACT was achieved, a 6 French 3.0 XB guide catheter was inserted.  A traverse coronary guidewire was used to cross the lesion.  The lesion was predilated with a 2.5 mm compliant balloon.  The lesion was then stented with a 2.75 by 20mm Promus Element stent to 15 atm.  The stent was postdilated with a 3.25 mm noncompliant balloon.  Following PCI, there was 0% residual stenosis and TIMI-3 flow. Final angiography confirmed an excellent result. Sheath was secured into place.  The patient tolerated the PCI procedure well. There were no immediate procedural complications.  The patient was transferred to the post catheterization recovery area for further monitoring.  PCI Data: Vessel - CFX/Segment - 19 Percent Stenosis (pre)  80 TIMI-flow 3 Stent 2.75 by 20 Promus Element post dil to 3..25 Percent Stenosis (post) 0 TIMI-flow (post) 3  Final Conclusions:   1.  Prior single vessel CAD with patent LIMA to the LAD 2.  High grade stenosis in the mid circumflex with hazy appearance and corresponding WMA 3.  Successful PCI with second generation DES  Recommendations:  1.  DAPT for twelve months or per the recommendation of the primary cardiologist.    Shawnie Pons 07/03/2011, 12:38 PM

## 2011-07-03 NOTE — Progress Notes (Signed)
UR Completed. [name], Devon Armond F [phone]  

## 2011-07-04 DIAGNOSIS — I2510 Atherosclerotic heart disease of native coronary artery without angina pectoris: Secondary | ICD-10-CM | POA: Diagnosis present

## 2011-07-04 DIAGNOSIS — I10 Essential (primary) hypertension: Secondary | ICD-10-CM | POA: Diagnosis present

## 2011-07-04 LAB — BASIC METABOLIC PANEL
CO2: 29 mEq/L (ref 19–32)
Chloride: 104 mEq/L (ref 96–112)
GFR calc non Af Amer: 71 mL/min — ABNORMAL LOW (ref >90)
Glucose, Bld: 88 mg/dL (ref 70–99)
Potassium: 4.1 mEq/L (ref 3.5–5.1)
Sodium: 142 mEq/L (ref 135–145)

## 2011-07-04 LAB — CBC
HCT: 44.4 % (ref 39.0–52.0)
MCHC: 35.6 g/dL (ref 30.0–36.0)

## 2011-07-04 MED ORDER — METOPROLOL TARTRATE 25 MG PO TABS: 25.0000 mg | ORAL_TABLET | Freq: Two times a day (BID) | ORAL | Status: DC

## 2011-07-04 MED ORDER — CLOPIDOGREL BISULFATE 75 MG PO TABS: 75.0000 mg | ORAL_TABLET | Freq: Every day | ORAL | Status: DC

## 2011-07-04 MED ORDER — ASPIRIN 81 MG PO CHEW: 81.0000 mg | CHEWABLE_TABLET | Freq: Every day | ORAL | Status: DC

## 2011-07-04 MED ORDER — NITROGLYCERIN 0.4 MG SL SUBL: 0.4000 mg | SUBLINGUAL_TABLET | SUBLINGUAL | Status: DC | PRN

## 2011-07-04 MED FILL — Dextrose Inj 5%: INTRAVENOUS | Qty: 50 | Status: AC

## 2011-07-04 NOTE — Discharge Summary (Signed)
Discharge Summary   Patient ID: Devon [name] MRN: [id number], DOB/AGE: 73-May-1942 73 y.o.  Primary MD: Dr. Jacky Kindle, [facility] Primary Cardiologist: Cassell Clement MD Admit date: 07/02/2011 D/C date:     07/04/2011      Primary Discharge Diagnoses:  1. Unstable Angina/CAD  - s/p CABG (~68yrs ago, LIMA to LAD)  - Cardiac cath 07/03/11 revealed EF 50%, patent LIMA to LAD, and high grade stenosis of mid LCx w/ successful DES placement 2. HLD  - LDL 120, H/o intolerance to statins  Secondary Discharge Diagnoses:  1. HTN 2. Gout 3. Plantar Fasciitis   Allergies No Known Allergies  Diagnostic Studies/Procedures:   07/03/11 - Cardiac Cath Hemodynamics:  AO 165/88 (119)  LV 159/16  Coronary angiography:  Coronary dominance: right  Left mainstem: No significant obstructiion  Left anterior descending (LAD): The LAD has about 50% at the bifurcation of the diagonal. There is competitive flow distally. The diagonal has about 60% narrowing at its ostium, then about 50% segmental plaque throughout the mid portion.  The LIMA to the distal LAD is widely patent.  Left circumflex (LCx): Has proximal calcium and 20% narrowing. OM 1 is normal. Just passed OM1 there is about 30% narrowing. At the takeoff and just beyond the small OM2, there is an eccentric 80% hazy lesion best seen in the LAO views. It is partially hidden in the RAO views due to overlap. The distal vessel is large.  Right coronary artery (RCA): Moderate size vessel with there smaller distal branches (PDA, PLA 1, PLA 2). There is 20% mid narrowing.  Left ventriculography: Left ventricular systolic function is abnormal with EF of 50% and hypokinesis of the distal inferior wall (appears CFX territory).  Final Conclusions:  1. Prior single vessel CAD with patent LIMA to the LAD  2. High grade stenosis in the mid circumflex with hazy appearance and corresponding WMA  3. Successful PCI with 2.75 by 20 Promus Element DES    Recommendations:  1. DAPT for twelve months or per the recommendation of the primary cardiologist.  History of Present Illness: 71 y.o. male w/ the above medical problems who presented to [facility] on 07/03/11 with complaints of exertional fatigue and chest pain.  The patient is status post coronary artery bypass grafting greater than 20 years ago and since that time has had no further cardiac interventions, but had a stress test 2 years ago which was reportedly normal. He reported that over the past 3-4 weeks he had progressive exertional fatigue. This past Friday night he woke at midnight with a sensation of an "elephant sitting on his chest" that lasted approximately 6 hours and was relieved with nitroglycerin. On day of presentation he had another episode that was severe in nature for which he called EMS.  Hospital Course: In the ED, EKG revealed NSR with PVCs and nonspecific ST/T changes. CXR was without acute cardiopulmonary abnormalities. Initial poc troponin was normal. Pain was relieved with nitroglycerin patch. He was given Plavix, initiated on IV heparin drip and BB, and admitted for further evaluation and treatment of suspected unstable angina.   Cardiac enzymes were cycled and remained negative. He had recurrent chest pain with new anterior T wave changes on EKG and was placed on IV NTG. He underwent cardiac cath on 07/03/11 revealing EF 50%, patent LIMA to LAD, and high grade stenosis of mid LCx w/ successful DES placement. He tolerated the procedure well without complications. Recommendations were made for DAPT for 12mos. He was  able to ambulate with cardiac rehab without any further chest pain. He has a history of intolerance to statins which he says he has tried multiple different ones all leading to "muscle damage".  He was seen and evaluated by Dr. Patty Sermons who felt he was stable for discharge home with plans for follow up as scheduled below.  Discharge Vitals: Blood  pressure 156/88, pulse 68, temperature 97.7 F (36.5 C), temperature source Oral, resp. rate 15, height 6\' 2"  (1.88 m), weight 190 lb 11.2 oz (86.5 kg), SpO2 96.00%.  Labs: Component Value Date   WBC 7.4 07/04/2011   HGB 15.8 07/04/2011   HCT 44.4 07/04/2011   MCV 88.8 07/04/2011   PLT 115* 07/04/2011    Lab 07/04/11 0420  NA 142  K 4.1  CL 104  CO2 29  BUN 10  CREATININE 1.02  CALCIUM 9.4  GLUCOSE 88   Basename 07/03/11 1552 07/03/11 0635 07/03/11 0031  CKTOTAL 38 39 48  CKMB 1.5 1.8 1.8  TROPONINI <0.30 <0.30 <0.30   Component Value Date   CHOL 198 07/03/2011   HDL 37* 07/03/2011   LDLCALC 120* 07/03/2011   TRIG 205* 07/03/2011     07/03/2011 16:20  Platelet Function  P2Y12 91 (L)     07/03/2011 00:31  Hemoglobin A1C 5.0     07/02/2011 21:36  Pro B Natriuretic peptide (BNP) 127.9 (H)    Discharge Medications   Medication List  As of 07/04/2011  9:32 AM   TAKE these medications         allopurinol 100 MG tablet   Commonly known as: ZYLOPRIM   Take 100 mg by mouth 2 (two) times daily.      aspirin 81 MG chewable tablet   Chew 1 tablet (81 mg total) by mouth daily.      benazepril 10 MG tablet   Commonly known as: LOTENSIN   Take 10 mg by mouth daily.      clopidogrel 75 MG tablet   Commonly known as: PLAVIX   Take 1 tablet (75 mg total) by mouth daily.      fish oil-omega-3 fatty acids 1000 MG capsule   Take 1 g by mouth daily.      FLAX SEED OIL PO   Take by mouth. Taking 1300 daily      metoprolol tartrate 25 MG tablet   Commonly known as: LOPRESSOR   Take 1 tablet (25 mg total) by mouth 2 (two) times daily.      multivitamin tablet   Take 1 tablet by mouth daily.      nitroGLYCERIN 0.4 MG SL tablet   Commonly known as: NITROSTAT   Place 1 tablet (0.4 mg total) under the tongue every 5 (five) minutes x 3 doses as needed for chest pain (up to 3 doses).      TURMERIC PO   Take by mouth daily.      Vitamin D3 2000 UNITS Tabs   Take by mouth daily.              Disposition   Discharge Orders    Future Appointments: Provider: Department: Dept Phone: Center:   07/11/2011 9:15 AM Rosalio Macadamia, NP [facility] [phone] None     Future Orders Please Complete By Expires   Diet - low sodium heart healthy      Increase activity slowly      Discharge instructions      Comments:   **PLEASE REMEMBER TO BRING ALL OF  YOUR MEDICATIONS TO EACH OF YOUR FOLLOW-UP OFFICE VISITS.  * KEEP GROIN SITE CLEAN AND DRY. Call the office for any signs of bleedings, pus, swelling, increased pain, or any other concerns. * NO HEAVY LIFTING (>10lbs) OR SEXUAL ACTIVITY X 7 DAYS. * NO DRIVING X 2-3 DAYS. * NO SOAKING BATHS, HOT TUBS, POOLS, ETC., X 7 DAYS.      Follow-up Information    Follow up with Norma Fredrickson, NP on 07/11/2011. (9:15)    Contact information:   [facility] [address] [place] [address] [phone]           Outstanding Labs/Studies:  1. EKG at next office visit.  Duration of Discharge Encounter: Greater than 30 minutes including physician and PA time.  Signed, Jeanni Allshouse PA-C 07/04/2011, 9:32 AM

## 2011-07-04 NOTE — Progress Notes (Signed)
CARDIAC REHAB PHASE I   PRE:  Rate/Rhythm: 64 SR    BP: sitting 153/74    SaO2:   MODE:  Ambulation: 680 ft   POST:  Rate/Rhythm: 105 ST    BP: sitting 156/88     SaO2:   Tolerated well. Ed completed. Discussed CRPII but pt is leaning toward returning to [facility]. Notified pt of increased LDL. [phone]  Harriet Masson CES, ACSM

## 2011-07-04 NOTE — Progress Notes (Signed)
Subjective:  Doing well post PCI yesterday.  No chest pain. Rhythm NSR with occ PVCs.  EKG today shows increase in T wave changes.  Objective:  Vital Signs in the last 24 hours: Temp:  [97.3 F (36.3 C)-97.9 F (36.6 C)] 97.7 F (36.5 C) (04/30 0455) Pulse Rate:  [47-88] 70  (04/30 0455) Resp:  [7-19] 15  (04/30 0455) BP: (109-178)/(45-99) 129/62 mmHg (04/30 0455) SpO2:  [96 %-100 %] 96 % (04/30 0007) FiO2 (%):  [2 %] 2 % (04/29 1535) Weight:  [86.5 kg (190 lb 11.2 oz)] 86.5 kg (190 lb 11.2 oz) (04/30 0455)  Intake/Output from previous day: 04/29 0701 - 04/30 0700 In: 600 [P.O.:600] Out: 1575 [Urine:1575] Intake/Output from this shift:       . allopurinol  100 mg Oral BID  . aspirin  324 mg Oral Pre-Cath  . aspirin  81 mg Oral Daily  . benazepril  10 mg Oral Daily  . bivalirudin      . clopidogrel  75 mg Oral Q breakfast  . fentaNYL      . heparin      . lidocaine      . metoprolol tartrate  25 mg Oral BID  . midazolam      . midazolam      . nitroGLYCERIN      . nitroGLYCERIN      . sodium chloride  3 mL Intravenous Q12H  . DISCONTD: aspirin  324 mg Oral NOW  . DISCONTD: aspirin EC  325 mg Oral Daily  . DISCONTD: aspirin  300 mg Rectal NOW  . DISCONTD: sodium chloride  3 mL Intravenous Q12H  . DISCONTD: sodium chloride  3 mL Intravenous Q12H      . sodium chloride 150 mL/hr at 07/03/11 1616  . DISCONTD: sodium chloride 75 mL/hr at 07/03/11 0054  . DISCONTD: sodium chloride 1 mL/kg/hr (07/03/11 0930)  . DISCONTD: bivalirudin (ANGIOMAX) infusion 0.5 mg/mL (Non-ACS indications)    . DISCONTD: heparin 1,200 Units/hr (07/03/11 0743)  . DISCONTD: nitroGLYCERIN Stopped (07/03/11 1126)    Physical Exam: The patient appears to be in no distress.  Head and neck exam reveals that the pupils are equal and reactive.  The extraocular movements are full.  There is no scleral icterus.  Mouth and pharynx are benign.  No lymphadenopathy.  No carotid bruits.  The jugular  venous pressure is normal.  Thyroid is not enlarged or tender.  Chest is clear to percussion and auscultation.  No rales or rhonchi.  Expansion of the chest is symmetrical.  Heart reveals no abnormal lift or heave.  First and second heart sounds are normal.  There is no murmur gallop rub or click.  The abdomen is soft and nontender.  Bowel sounds are normoactive.  There is no hepatosplenomegaly or mass.  There are no abdominal bruits.  Extremities reveal no phlebitis or edema.  Pedal pulses are good.  There is no cyanosis or clubbing. Right radial pulse good.  Neurologic exam is normal strength and no lateralizing weakness.  No sensory deficits.  Integument reveals no rash  Lab Results:  Basename 07/04/11 0420 07/03/11 0849  WBC 7.4 6.3  HGB 15.8 16.0  PLT 115* 114*    Basename 07/04/11 0420 07/03/11 0849  NA 142 141  K 4.1 4.0  CL 104 104  CO2 29 28  GLUCOSE 88 95  BUN 10 14  CREATININE 1.02 0.92    Basename 07/03/11 1552 07/03/11 0635  TROPONINI <0.30 <0.30  Hepatic Function Panel No results found for this basename: PROT,ALBUMIN,AST,ALT,ALKPHOS,BILITOT,BILIDIR,IBILI in the last 72 hours  Basename 07/03/11 0635  CHOL 198   No results found for this basename: PROTIME in the last 72 hours  Imaging: Dg Chest Portable 1 View  07/02/2011  *RADIOLOGY REPORT*  Clinical Data: Chest pain, tightness.  PORTABLE CHEST - 1 VIEW  Comparison: None.  Findings: Heart size upper normal to mildly large status post median sternotomy and CABG.  Interstitial prominence is nonspecific, may reflect senescent changes.  No focal consolidation.  No pleural effusion or pneumothorax.  No acute osseous abnormality.  IMPRESSION: Interstitial prominence without focal consolidation.  Original Report Authenticated By: Waneta Martins, M.D.    Cardiac Studies:  Assessment/Plan:  Patient Active Hospital Problem List: Unstable angina pectoris (07/03/2011)   Assessment: Successful DES to CFX  yesterday   Plan: Okay for discharge today. Quiet activity at home this week. Discussed outpatient rehab with patient. He would prefer to [facility] at the [facility]. See me or Lawson Fiscal in 1 week for OV and EKG   LOS: 2 days    Devon [name] 07/04/2011, 7:37 AM

## 2011-07-11 ENCOUNTER — Encounter: Payer: Self-pay | Admitting: Nurse Practitioner

## 2011-07-11 ENCOUNTER — Telehealth: Payer: Self-pay | Admitting: *Deleted

## 2011-07-11 ENCOUNTER — Ambulatory Visit (INDEPENDENT_AMBULATORY_CARE_PROVIDER_SITE_OTHER): Payer: Medicare Other | Admitting: Nurse Practitioner

## 2011-07-11 VITALS — BP 118/88 | HR 67 | Ht 74.0 in | Wt 189.6 lb

## 2011-07-11 DIAGNOSIS — I10 Essential (primary) hypertension: Secondary | ICD-10-CM

## 2011-07-11 DIAGNOSIS — I2510 Atherosclerotic heart disease of native coronary artery without angina pectoris: Secondary | ICD-10-CM

## 2011-07-11 DIAGNOSIS — E785 Hyperlipidemia, unspecified: Secondary | ICD-10-CM

## 2011-07-11 LAB — BASIC METABOLIC PANEL
BUN: 16 mg/dL (ref 6–23)
CO2: 24 mEq/L (ref 19–32)
Calcium: 9.5 mg/dL (ref 8.4–10.5)
Chloride: 105 mEq/L (ref 96–112)
Creatinine, Ser: 1.1 mg/dL (ref 0.4–1.5)
GFR: 69.71 mL/min (ref >60.00)
Glucose, Bld: 97 mg/dL (ref 70–99)
Potassium: 4.3 mEq/L (ref 3.5–5.1)
Sodium: 140 mEq/L (ref 135–145)

## 2011-07-11 LAB — CBC WITH DIFFERENTIAL/PLATELET
Basophils Absolute: 0 10*3/uL (ref 0.0–0.1)
Basophils Relative: 0.5 % (ref 0.0–3.0)
Eosinophils Absolute: 0 10*3/uL (ref 0.0–0.7)
Eosinophils Relative: 0.8 % (ref 0.0–5.0)
HCT: 48.5 % (ref 39.0–52.0)
Hemoglobin: 16.4 g/dL (ref 13.0–17.0)
Lymphocytes Relative: 24.2 % (ref 12.0–46.0)
Lymphs Abs: 1.3 10*3/uL (ref 0.7–4.0)
MCHC: 33.7 g/dL (ref 30.0–36.0)
MCV: 94.2 fl (ref 78.0–100.0)
Monocytes Absolute: 0.5 10*3/uL (ref 0.1–1.0)
Monocytes Relative: 8.8 % (ref 3.0–12.0)
Neutro Abs: 3.4 10*3/uL (ref 1.4–7.7)
Neutrophils Relative %: 65.7 % (ref 43.0–77.0)
Platelets: 134 10*3/uL — ABNORMAL LOW (ref 150.0–400.0)
RBC: 5.15 Mil/uL (ref 4.22–5.81)
RDW: 13.6 % (ref 11.5–14.6)
WBC: 5.2 10*3/uL (ref 4.5–10.5)

## 2011-07-11 NOTE — Assessment & Plan Note (Signed)
Blood pressure looks ok. No change in his current regimen.

## 2011-07-11 NOTE — Progress Notes (Signed)
Devon [name] Date of Birth: 06/19/1940 Medical Record #[id number]  History of Present Illness: Devon [name] is seen today for a post hospital visit. He is seen for Dr. Patty Sermons. He has known CAD with remote CABG back in 1991 with LIMA to the LAD. He also has HTN and HLD. He is intolerant to both zetia and statin therapy. He has had recent decrease in his exercise tolerance and chest pain. He did have anterior T wave changes on his EKG. He underwent cath with subsequent DES to the mid LCX. EF was 50%.   He comes in today. He is doing well and feeling much better. No further chest pain. [facility] Blood pressure has been ok for the most part. He is tolerating his Plavix. No problems reported with his groin. He feels like he is doing well. He does not wish to go to cardiac rehab. Platelet count was a little low at discharge and we will recheck today.   Current Outpatient Prescriptions on File Prior to Visit  Medication Sig Dispense Refill  . allopurinol (ZYLOPRIM) 100 MG tablet Take 100 mg by mouth 2 (two) times daily.      Marland Kitchen aspirin 81 MG chewable tablet Chew 1 tablet (81 mg total) by mouth daily.      . benazepril (LOTENSIN) 10 MG tablet Take 10 mg by mouth daily.      . Cholecalciferol (VITAMIN D3) 2000 UNITS TABS Take by mouth daily.      . clopidogrel (PLAVIX) 75 MG tablet Take 1 tablet (75 mg total) by mouth daily.  30 tablet  6  . fish oil-omega-3 fatty acids 1000 MG capsule Take 1 g by mouth daily.      . Flaxseed, Linseed, (FLAX SEED OIL PO) Take by mouth. Taking 1300 daily      . metoprolol tartrate (LOPRESSOR) 25 MG tablet Take 1 tablet (25 mg total) by mouth 2 (two) times daily.  60 tablet  6  . nitroGLYCERIN (NITROSTAT) 0.4 MG SL tablet Place 1 tablet (0.4 mg total) under the tongue every 5 (five) minutes x 3 doses as needed for chest pain (up to 3 doses).  25 tablet  3    Allergies  Allergen Reactions  . Statins     Myalgias  . Zetia (Ezetimibe)    Myalgias    Past Medical History  Diagnosis Date  . Coronary artery disease     s/p CABG 1991 with LIMA to LAD, s/p DES to mid LCX 06/2011  . Hypertension   . Hyperlipidemia     statin intolerant; intolerant to zetia  . Bilateral plantar fasciitis   . H/O: gout     Past Surgical History  Procedure Date  . Coronary artery bypass graft 1991    severe 90% ostial LAD lesion with LIMA to LAD  . Cardiac catheterization April 2013    DES to mid LCX; EF of 50%.     History  Smoking status  . Never Smoker   Smokeless tobacco  . Not on file    History  Alcohol Use  . Yes    Occassional Use    History reviewed. No pertinent family history.  Review of Systems: The review of systems is per the HPI.  All other systems were reviewed and are negative.  Physical Exam: BP 118/88  Pulse 67  Ht 6\' 2"  (1.88 m)  Wt 189 lb 9.6 oz (86.002 kg)  BMI 24.34 kg/m2 Patient is very pleasant  and in no acute distress. Skin is warm and dry. Color is normal.  HEENT is unremarkable. Normocephalic/atraumatic. PERRL. Sclera are nonicteric. Neck is supple. No masses. No JVD. Lungs are clear. Cardiac exam shows a regular rate and rhythm. Abdomen is soft. Extremities are without edema. Gait and ROM are intact. No gross neurologic deficits noted.   LABORATORY DATA:   EKG today shows sinus rhythm with nonspecific ST and T wave changes.   Lab Results  Component Value Date   WBC 7.4 07/04/2011   HGB 15.8 07/04/2011   HCT 44.4 07/04/2011   PLT 115* 07/04/2011   GLUCOSE 88 07/04/2011   CHOL 198 07/03/2011   TRIG 205* 07/03/2011   HDL 37* 07/03/2011   LDLCALC 120* 07/03/2011   NA 142 07/04/2011   K 4.1 07/04/2011   CL 104 07/04/2011   CREATININE 1.02 07/04/2011   BUN 10 07/04/2011   CO2 29 07/04/2011   INR 0.92 07/03/2011   HGBA1C 5.0 07/03/2011    Cardiac Cath Final Conclusions:  1. Prior single vessel CAD with patent LIMA to the LAD  2. High grade stenosis in the mid circumflex with hazy appearance and  corresponding WMA  3. Successful PCI with second generation DES  Recommendations:  1. DAPT for twelve months or per the recommendation of the primary cardiologist.    Assessment / Plan:

## 2011-07-11 NOTE — Telephone Encounter (Signed)
Message copied by Burnell Blanks on Tue Jul 11, 2011  3:11 PM ------      Message from: Rosalio Macadamia      Created: Tue Jul 11, 2011  1:19 PM       Ok to report. Labs are satisfactory.

## 2011-07-11 NOTE — Patient Instructions (Signed)
Keep up your exercise routine  I think you are doing well  We will see you back in 2 months.  Call the [facility] office at [phone] if you have any questions, problems or concerns.

## 2011-07-11 NOTE — Assessment & Plan Note (Signed)
He is now s/p DES to the mid LCX. He is doing well. He is committed to Plavix for one year. Will recheck a CBC today since his platelet count was decreased at the time of his hospitalization. He is back exercising. He does not wish to go to cardiac rehab. We will see him back in about 2 months. Patient is agreeable to this plan and will call if any problems develop in the interim.

## 2011-07-11 NOTE — Assessment & Plan Note (Signed)
Unfortunately, he does not tolerate statins nor Zetia. He will continue with diet and exercise.

## 2011-07-11 NOTE — Telephone Encounter (Signed)
Advised of labs 

## 2011-08-01 ENCOUNTER — Telehealth: Payer: Self-pay | Admitting: Cardiology

## 2011-08-01 NOTE — Telephone Encounter (Signed)
Pt would like 07-11-11 blood work report Mailed to him

## 2011-08-01 NOTE — Telephone Encounter (Signed)
Mailed copy of last cholesterol check to patient

## 2011-08-02 ENCOUNTER — Telehealth: Payer: Self-pay | Admitting: Cardiology

## 2011-08-02 NOTE — Telephone Encounter (Signed)
Please return call to patient at [phone] regarding metoprolol med and low BP

## 2011-08-02 NOTE — Telephone Encounter (Signed)
Decrease the metoprolol tartrate to just 12.5 mg twice a day and observe response

## 2011-08-02 NOTE — Telephone Encounter (Signed)
Takes Benzapril 10 mg daily and Metoprolol tart in am.  Has been feeling real washed out and today when back from [facility] his systolic blood pressure was down 95.  The Metoprolol tart 25 mg twice a day is a newer medication.  Will forward to  Dr. Patty Sermons for review

## 2011-08-02 NOTE — Telephone Encounter (Signed)
Discussed further with  Dr. Patty Sermons and will just hold Benazapril for now and continue his Metoprolol twice daily.  Systolic lood pressure only around 115 now so he will just take 1/2 tablet tonight.  Advised patient and he will call back with any problems

## 2011-09-12 ENCOUNTER — Encounter: Payer: Self-pay | Admitting: Cardiology

## 2011-09-12 ENCOUNTER — Ambulatory Visit (INDEPENDENT_AMBULATORY_CARE_PROVIDER_SITE_OTHER): Payer: Medicare Other | Admitting: Cardiology

## 2011-09-12 VITALS — BP 134/90 | HR 76 | Ht 74.0 in | Wt 188.0 lb

## 2011-09-12 DIAGNOSIS — I119 Hypertensive heart disease without heart failure: Secondary | ICD-10-CM

## 2011-09-12 DIAGNOSIS — E785 Hyperlipidemia, unspecified: Secondary | ICD-10-CM

## 2011-09-12 DIAGNOSIS — E7800 Pure hypercholesterolemia, unspecified: Secondary | ICD-10-CM

## 2011-09-12 DIAGNOSIS — I2510 Atherosclerotic heart disease of native coronary artery without angina pectoris: Secondary | ICD-10-CM

## 2011-09-12 DIAGNOSIS — I10 Essential (primary) hypertension: Secondary | ICD-10-CM

## 2011-09-12 NOTE — Assessment & Plan Note (Signed)
Generally his blood pressure is normally well controlled.  July 4 this year was his 50th wedding anniversary.  His wife has been deceased for several years from cancer.  On July 4 the patient drank a moderate amount of bourbon and ate a lot of pickles.  The next day he noted that his blood pressure was a little high but felt well.  He noted that actually he feels better if his systolic is above 120 day after that he spent a good part of the day cleaning house and had another generous helping of bourbon and pickups the following day which was Sunday, July 7 his systolic pressure had risen to 148.  He then realized all the salt in pickles and has avoided them subsequently and his blood pressure is starting to come back down.

## 2011-09-12 NOTE — Assessment & Plan Note (Signed)
The patient has a history of dyslipidemia but is unable to take either statins or ezetimibe.  He exercises faithfully at the [facility] and has been watching his diet carefully.  We'll plan to check his lipids when he returns in 4 months

## 2011-09-12 NOTE — Patient Instructions (Signed)
Increase your Lotensin to twice a day for three (3) days only  Your physician recommends that you schedule a follow-up appointment in: 4 months with fasting labs (lp/bmet/hfp)

## 2011-09-12 NOTE — Progress Notes (Signed)
Devon [name] Date of Birth:  [birth year] [facility] [address] [place], [place]  [address] [phone]         Fax   [phone]  History of Present Illness: This pleasant 71 year old scheduled followup office visitgentleman is seen for a scheduled followup office visit.  He has a history of known ischemic heart disease.  He had remote CABG in 1991 with a LIMA to the LAD.  In 2013 he developed a decrease in exercise tolerance and new anterior T wave changes on his EKG and underwent cardiac catheterization with subsequent DES to the mid circumflex.  His ejection fraction was 50%.  He has a history of dyslipidemia but is intolerant to statins and also to ezetimibe.  Current Outpatient Prescriptions  Medication Sig Dispense Refill  . allopurinol (ZYLOPRIM) 100 MG tablet Take 100 mg by mouth 2 (two) times daily.      Marland Kitchen aspirin 81 MG chewable tablet Chew 1 tablet (81 mg total) by mouth daily.      . benazepril (LOTENSIN) 10 MG tablet Take 10 mg by mouth daily.      . Cholecalciferol (VITAMIN D3) 2000 UNITS TABS Take by mouth daily.      . clopidogrel (PLAVIX) 75 MG tablet Take 1 tablet (75 mg total) by mouth daily.  30 tablet  6  . Coenzyme Q10 (CO Q 10 PO) Take by mouth daily.      . fish oil-omega-3 fatty acids 1000 MG capsule Take 1 g by mouth daily.      . Flaxseed, Linseed, (FLAX SEED OIL PO) Take by mouth. Taking 1300 daily      . nitroGLYCERIN (NITROSTAT) 0.4 MG SL tablet Place 1 tablet (0.4 mg total) under the tongue every 5 (five) minutes x 3 doses as needed for chest pain (up to 3 doses).  25 tablet  3  . metoprolol tartrate (LOPRESSOR) 25 MG tablet Take 1 tablet (25 mg total) by mouth 2 (two) times daily.  60 tablet  6    Allergies  Allergen Reactions  . Metoprolol     Chest pressure  . Statins     Myalgias  . Zetia (Ezetimibe)     Myalgias    Patient Active Problem List  Diagnosis  . Bilateral plantar fasciitis  . H/O: gout  . CAD (coronary  artery disease)  . HTN (hypertension)  . HLD (hyperlipidemia)    History  Smoking status  . Never Smoker   Smokeless tobacco  . Not on file    History  Alcohol Use  . Yes    Occassional Use    No family history on file.  Review of Systems: Constitutional: no fever chills diaphoresis or fatigue or change in weight.  Head and neck: no hearing loss, no epistaxis, no photophobia or visual disturbance. Respiratory: No cough, shortness of breath or wheezing. Cardiovascular: No chest pain peripheral edema, palpitations. Gastrointestinal: No abdominal distention, no abdominal pain, no change in bowel habits hematochezia or melena. Genitourinary: No dysuria, no frequency, no urgency, no nocturia. Musculoskeletal:No arthralgias, no back pain, no gait disturbance or myalgias. Neurological: No dizziness, no headaches, no numbness, no seizures, no syncope, no weakness, no tremors. Hematologic: No lymphadenopathy, no easy bruising. Psychiatric: No confusion, no hallucinations, no sleep disturbance.    Physical Exam: Filed Vitals:   09/12/11 0945  BP: 134/90  Pulse: 76   general appearance reveals a well-developed well-nourished gentleman in no distress.The head and neck exam reveals pupils equal and reactive.  Extraocular movements are full.  There is no scleral icterus.  The mouth and pharynx are normal.  The neck is supple.  The carotids reveal no bruits.  The jugular venous pressure is normal.  The  thyroid is not enlarged.  There is no lymphadenopathy.  The chest is clear to percussion and auscultation.  There are no rales or rhonchi.  Expansion of the chest is symmetrical.  The precordium is quiet.  The first heart sound is normal.  The second heart sound is physiologically split.  There is no murmur gallop rub or click.  There is no abnormal lift or heave.  The abdomen is soft and nontender.  The bowel sounds are normal.  The liver and spleen are not enlarged.  There are no abdominal  masses.  There are no abdominal bruits.  Extremities reveal good pedal pulses.  There is no phlebitis or edema.  There is no cyanosis or clubbing.  Strength is normal and symmetrical in all extremities.  There is no lateralizing weakness.  There are no sensory deficits.  The skin is warm and dry.  There is no rash.     Assessment / Plan: His blood pressure is still too high as a result of his recent dietary indiscretion.  We will have him double up on his Lotensin for the next 3 days then resume just once a day after that.  Recheck in 4 months for followup office visit and fasting lab work.  Continue prudent diet and avoid excessive salt.

## 2011-09-12 NOTE — Assessment & Plan Note (Signed)
The patient has not been experiencing any exertional chest discomfort.  Interestingly, after his recent cardiac catheterization and stent he was placed on metoprolol.  He noted that with metoprolol he was experiencing exertional chest tightness.  The symptoms continued even when he cut his dose in half.  After he stopped his metoprolol altogether his symptoms stopped and have not recurred.  He is felt to be intolerant of metoprolol.

## 2011-10-30 ENCOUNTER — Telehealth: Payer: Self-pay | Admitting: Cardiology

## 2011-10-30 NOTE — Telephone Encounter (Signed)
Coming to see  Dr. Patty Sermons in am

## 2011-10-30 NOTE — Telephone Encounter (Signed)
Pt calling re plavix [phone]

## 2011-10-31 ENCOUNTER — Ambulatory Visit (INDEPENDENT_AMBULATORY_CARE_PROVIDER_SITE_OTHER): Payer: Medicare Other | Admitting: Cardiology

## 2011-10-31 ENCOUNTER — Encounter: Payer: Self-pay | Admitting: Cardiology

## 2011-10-31 VITALS — BP 146/82 | HR 77 | Ht 74.0 in | Wt 186.4 lb

## 2011-10-31 DIAGNOSIS — I2510 Atherosclerotic heart disease of native coronary artery without angina pectoris: Secondary | ICD-10-CM

## 2011-10-31 MED ORDER — METOPROLOL SUCCINATE ER 25 MG PO TB24: ORAL_TABLET | ORAL | Status: DC

## 2011-10-31 MED ORDER — CLOPIDOGREL BISULFATE 75 MG PO TABS: 75.0000 mg | ORAL_TABLET | Freq: Every day | ORAL | Status: DC

## 2011-10-31 NOTE — Progress Notes (Signed)
Devon [name] Date of Birth:  [birth year] [facility] [address] [place], [place]  [address] [phone]  Fax   [phone]  HPI: This pleasant 71 year old is seen for a work in followup office visit. He has a history of known ischemic heart disease. He had remote CABG in 1991 with a LIMA to the LAD. In 2013 he developed a decrease in exercise tolerance and new anterior T wave changes on his EKG and underwent cardiac catheterization with subsequent DES to the mid circumflex. His ejection fraction was 50%. He has a history of dyslipidemia but is intolerant to statins and also to ezetimibe.  He had his annual physical examination earlier this week with Dr. Jacky Kindle.  He was very pleased that all of his blood work turned out okay.  Prior to getting his reports he was concerned that he might have some type of occult process going on.  He was also experiencing some dizziness which he attributed to Plavix.  He indicated a desire to stop Plavix.  We asked him to come in for the office visit today to discuss alternatives.  However, since he has gotten his good reports from Dr. Jacky Kindle he feels better and now is not sure that he wants to change from Plavix after all.  In fact he would rather stay with Plavix and take it in the evening instead of the morning and see if his symptoms of mild lightheadedness resolve.  If they do not then we would want to switch him to something like Effient.  He will need to be on dual antiplatelet therapy until at least 07/02/2012 which will be the one year anniversary of his procedure.   Current Outpatient Prescriptions  Medication Sig Dispense Refill  . allopurinol (ZYLOPRIM) 100 MG tablet Take 100 mg by mouth 2 (two) times daily.      Marland Kitchen aspirin 81 MG chewable tablet Chew 1 tablet (81 mg total) by mouth daily.      . benazepril (LOTENSIN) 10 MG tablet Take 10 mg by mouth daily.      . Cholecalciferol (VITAMIN D3) 2000 UNITS TABS Take by mouth daily.       . clopidogrel (PLAVIX) 75 MG tablet Take 1 tablet (75 mg total) by mouth at bedtime.  90 tablet  3  . Coenzyme Q10 (CO Q 10 PO) Take by mouth daily.      . fish oil-omega-3 fatty acids 1000 MG capsule Take 1 g by mouth daily.      . Flaxseed, Linseed, (FLAX SEED OIL PO) Take by mouth. Taking 1300 daily      . nitroGLYCERIN (NITROSTAT) 0.4 MG SL tablet Place 1 tablet (0.4 mg total) under the tongue every 5 (five) minutes x 3 doses as needed for chest pain (up to 3 doses).  25 tablet  3  . DISCONTD: clopidogrel (PLAVIX) 75 MG tablet Take 1 tablet (75 mg total) by mouth daily.  30 tablet  6  . metoprolol succinate (TOPROL-XL) 25 MG 24 hr tablet Take 1/2 tablet daily  45 tablet  3    Allergies  Allergen Reactions  . Metoprolol     Chest pressure  . Statins     Myalgias  . Zetia (Ezetimibe)     Myalgias    Patient Active Problem List  Diagnosis  . Bilateral plantar fasciitis  . H/O: gout  . CAD (coronary artery disease)  . HTN (hypertension)  . HLD (hyperlipidemia)    History  Smoking status  .  Never Smoker   Smokeless tobacco  . Not on file    History  Alcohol Use  . Yes    Occassional Use    No family history on file.  Review of Systems: The patient denies any heat or cold intolerance.  No weight gain or weight loss.  The patient denies headaches or blurry vision.  There is no cough or sputum production.  The patient denies dizziness.  There is no hematuria or hematochezia.  The patient denies any muscle aches or arthritis.  The patient denies any rash.  The patient denies frequent falling or instability.  There is no history of depression or anxiety.  All other systems were reviewed and are negative.   Physical Exam: Filed Vitals:   10/31/11 0902  BP: 146/82  Pulse: 77   the general appearance reveals a well-developed well-nourished gentleman in no distress.Pupils equal and reactive.   Extraocular Movements are full.  There is no scleral icterus.  The mouth and  pharynx are normal.  The neck is supple.  The carotids reveal no bruits.  The jugular venous pressure is normal.  The thyroid is not enlarged.  There is no lymphadenopathy.  The chest is clear to percussion and auscultation. There are no rales or rhonchi. Expansion of the chest is symmetrical.  The precordium is quiet.  The first heart sound is normal.  The second heart sound is physiologically split.  There is no murmur gallop rub or click.  There is no abnormal lift or heave.  The abdomen is soft and nontender. Bowel sounds are normal. The liver and spleen are not enlarged. There Are no abdominal masses. There are no bruits.  The pedal pulses are good.  There is no phlebitis or edema.  There is no cyanosis or clubbing. Strength is normal and symmetrical in all extremities.  There is no lateralizing weakness.  There are no sensory deficits.      Assessment / Plan: The patient will continue with Plavix for now but will take it in the evening instead the morning.  He will keep [place] posted as to whether his symptoms recur.  Also on his own he had stopped taking his beta blocker altogether.  Because of his history of coronary disease I want him to be on some beta blocker and we will have him resume at a lower dose of just 12.5 mg Toprol once a day Recheck in 6 months for followup office visit and EKG

## 2011-10-31 NOTE — Patient Instructions (Signed)
Start taking Plavix at bedtime and if no better call back  Start Toprol XL (metoprolol) 25 mg 1/2 tablet daily  Your physician recommends that you schedule a follow-up appointment in: 6 week ov/ekg

## 2011-11-02 ENCOUNTER — Ambulatory Visit: Payer: Medicare Other | Admitting: Cardiology

## 2011-12-06 ENCOUNTER — Ambulatory Visit: Payer: Medicare Other | Admitting: Cardiology

## 2012-01-10 ENCOUNTER — Ambulatory Visit: Payer: Medicare Other | Admitting: Cardiology

## 2012-01-15 ENCOUNTER — Other Ambulatory Visit (INDEPENDENT_AMBULATORY_CARE_PROVIDER_SITE_OTHER): Payer: Medicare Other

## 2012-01-15 DIAGNOSIS — I119 Hypertensive heart disease without heart failure: Secondary | ICD-10-CM

## 2012-01-15 DIAGNOSIS — E7800 Pure hypercholesterolemia, unspecified: Secondary | ICD-10-CM

## 2012-01-15 LAB — BASIC METABOLIC PANEL
CO2: 31 mEq/L (ref 19–32)
Glucose, Bld: 93 mg/dL (ref 70–99)
Potassium: 4.2 mEq/L (ref 3.5–5.1)
Sodium: 141 mEq/L (ref 135–145)

## 2012-01-15 LAB — HEPATIC FUNCTION PANEL
AST: 30 U/L (ref 0–37)
Total Bilirubin: 0.9 mg/dL (ref 0.3–1.2)

## 2012-01-15 LAB — LIPID PANEL
HDL: 34.8 mg/dL — ABNORMAL LOW (ref >39.00)
Total CHOL/HDL Ratio: 6
Triglycerides: 372 mg/dL — ABNORMAL HIGH (ref 0.0–149.0)

## 2012-01-15 NOTE — Progress Notes (Signed)
Quick Note:  Please make copy of labs for patient visit. ______ 

## 2012-01-17 ENCOUNTER — Encounter: Payer: Self-pay | Admitting: Cardiology

## 2012-01-17 ENCOUNTER — Ambulatory Visit (INDEPENDENT_AMBULATORY_CARE_PROVIDER_SITE_OTHER): Payer: Medicare Other | Admitting: Cardiology

## 2012-01-17 VITALS — BP 138/77 | HR 75 | Ht 74.0 in | Wt 187.6 lb

## 2012-01-17 DIAGNOSIS — I2510 Atherosclerotic heart disease of native coronary artery without angina pectoris: Secondary | ICD-10-CM

## 2012-01-17 DIAGNOSIS — E785 Hyperlipidemia, unspecified: Secondary | ICD-10-CM

## 2012-01-17 DIAGNOSIS — I259 Chronic ischemic heart disease, unspecified: Secondary | ICD-10-CM

## 2012-01-17 NOTE — Assessment & Plan Note (Signed)
The patient has a history of hyperlipidemia.  We reviewed his lipids today.  He is not at target.  He states that he has been eating anything he wants to regain his strength after having a flulike illness a month ago.  He is now back up to his pre-illness weight and he will now go back on his strict diet.

## 2012-01-17 NOTE — Progress Notes (Signed)
Devon [name] Date of Birth:  1941/01/26 [facility] [address] [place], [place]  [address] [phone]  Fax   [phone]  HPI: This pleasant 71 year old  is seen for a scheduled followup office visit. He has a history of known ischemic heart disease. He had remote CABG in 1991 with a LIMA to the LAD. In 2013 he developed a decrease in exercise tolerance and new anterior T wave changes on his EKG and underwent cardiac catheterization with subsequent DES to the mid circumflex. His ejection fraction was 50%. He has a history of dyslipidemia but is intolerant to statins and also to ezetimibe.  Since last visit he has had no new cardiac symptoms.  He was very sick with a flulike illness in early October and ran fever for a week and lost 8 pounds.   Current Outpatient Prescriptions  Medication Sig Dispense Refill  . allopurinol (ZYLOPRIM) 100 MG tablet Take 100 mg by mouth 2 (two) times daily.      Marland Kitchen aspirin 81 MG chewable tablet Chew 1 tablet (81 mg total) by mouth daily.      . benazepril (LOTENSIN) 10 MG tablet Take 10 mg by mouth daily.      . Cholecalciferol (VITAMIN D3) 2000 UNITS TABS Take by mouth daily.      . clopidogrel (PLAVIX) 75 MG tablet Take 1 tablet (75 mg total) by mouth at bedtime.  90 tablet  3  . Coenzyme Q10 (CO Q 10 PO) Take by mouth daily.      . fish oil-omega-3 fatty acids 1000 MG capsule Take 1 g by mouth daily.      . Flaxseed, Linseed, (FLAX SEED OIL PO) Take by mouth. Taking 1300 daily      . metoprolol succinate (TOPROL-XL) 25 MG 24 hr tablet Take 1/2 tablet daily  45 tablet  3  . nitroGLYCERIN (NITROSTAT) 0.4 MG SL tablet Place 1 tablet (0.4 mg total) under the tongue every 5 (five) minutes x 3 doses as needed for chest pain (up to 3 doses).  25 tablet  3    Allergies  Allergen Reactions  . Metoprolol     Chest pressure  . Statins     Myalgias  . Zetia (Ezetimibe)     Myalgias    Patient Active Problem List  Diagnosis  .  Bilateral plantar fasciitis  . H/O: gout  . CAD (coronary artery disease)  . HTN (hypertension)  . HLD (hyperlipidemia)    History  Smoking status  . Never Smoker   Smokeless tobacco  . Not on file    History  Alcohol Use  . Yes    Comment: Occassional Use    No family history on file.  Review of Systems: The patient denies any heat or cold intolerance.  No weight gain or weight loss.  The patient denies headaches or blurry vision.  There is no cough or sputum production.  The patient denies dizziness.  There is no hematuria or hematochezia.  The patient denies any muscle aches or arthritis.  The patient denies any rash.  The patient denies frequent falling or instability.  There is no history of depression or anxiety.  All other systems were reviewed and are negative.   Physical Exam: Filed Vitals:   01/17/12 0859  BP: 138/77  Pulse: 75   the general appearance reveals a well-developed well-nourished gentleman in no distress.The head and neck exam reveals pupils equal and reactive.  Extraocular movements are full.  There  is no scleral icterus.  The mouth and pharynx are normal.  The neck is supple.  The carotids reveal no bruits.  The jugular venous pressure is normal.  The  thyroid is not enlarged.  There is no lymphadenopathy.  The chest is clear to percussion and auscultation.  There are no rales or rhonchi.  Expansion of the chest is symmetrical.  The precordium is quiet.  The first heart sound is normal.  The second heart sound is physiologically split.  There is no murmur gallop rub or click.  There is no abnormal lift or heave.  The abdomen is soft and nontender.  The bowel sounds are normal.  The liver and spleen are not enlarged.  There are no abdominal masses.  There are no abdominal bruits.  Extremities reveal good pedal pulses.  There is no phlebitis or edema.  There is no cyanosis or clubbing.  Strength is normal and symmetrical in all extremities.  There is no  lateralizing weakness.  There are no sensory deficits.  The skin is warm and dry.  There is no rash.      Assessment / Plan: Continue same medication.  Get back on strict diet.  Recheck in 6 months for followup office visit EKG lipid panel hepatic function panel and basal metabolic panel.

## 2012-01-17 NOTE — Patient Instructions (Addendum)
Your physician wants you to follow-up in: 6 months  You will receive a reminder letter in the mail two months in advance. If you don't receive a letter, please call our office to schedule the follow-up appointment.   Fat and Cholesterol Control Diet Cholesterol is a wax-like substance. It comes from your liver and is found in certain foods. There is good (HDL) and bad (LDL) cholesterol. Too much cholesterol in your blood can affect your heart. Certain foods can lower or raise your cholesterol. Eat foods that are low in cholesterol. Saturated and trans fats are bad fats found in foods that will raise your cholesterol. Do not eat foods that are high in saturated and trans fats. FOODS HIGHER IN SATURATED AND TRANS FATS  Dairy products, such as whole milk, eggs, cheese, cream, and butter.  Fatty meats, such as hot dogs, sausage, and salami.  Fried foods.  Trans fats which are found in margarine and pre-made cookies, crackers, and baked goods.  Tropical oils, such as coconut and palm oils. Read package labels at the store. Do not buy products that use saturated or trans fats or hydrogenated oils. Find foods labeled:  Low-fat.  Low-saturated fat.  Trans-fat-free.  Low-cholesterol. FOODS LOWER IN CHOLESTEROL   Fruit.  Vegetables.  Beans, peas, and lentils.  Fish.  Lean meat, such as chicken (without skin) or ground [place].  Grains, such as barley, rice, couscous, bulgur wheat, and pasta.  Heart-healthy tub margarine. PREPARING YOUR FOOD  Broil, bake, steam, or roast foods. Do not fry food.  Use non-stick cooking sprays.  Use lemon or herbs to flavor food instead of using butter or stick margarine.  Use nonfat yogurt, salsa, or low-fat dressings for salads. Document Released: 08/22/2011 Document Reviewed: 05/23/2011 [facility] Patient Information 2013 [place], [place].

## 2012-01-17 NOTE — Assessment & Plan Note (Signed)
[facility] and also upper body machines.  He has not been experiencing any chest pain or shortness of breath.  At his last visit we put him on low-dose beta blocker Toprol 12.5 mg daily which he is tolerating without side effects.

## 2012-04-20 ENCOUNTER — Other Ambulatory Visit: Payer: Self-pay

## 2012-07-17 ENCOUNTER — Encounter: Payer: Self-pay | Admitting: Cardiology

## 2012-07-17 ENCOUNTER — Ambulatory Visit (INDEPENDENT_AMBULATORY_CARE_PROVIDER_SITE_OTHER): Payer: Medicare Other | Admitting: Cardiology

## 2012-07-17 VITALS — BP 146/78 | HR 62 | Ht 74.0 in | Wt 188.8 lb

## 2012-07-17 DIAGNOSIS — Z8739 Personal history of other diseases of the musculoskeletal system and connective tissue: Secondary | ICD-10-CM

## 2012-07-17 DIAGNOSIS — I2510 Atherosclerotic heart disease of native coronary artery without angina pectoris: Secondary | ICD-10-CM

## 2012-07-17 DIAGNOSIS — Z862 Personal history of diseases of the blood and blood-forming organs and certain disorders involving the immune mechanism: Secondary | ICD-10-CM

## 2012-07-17 DIAGNOSIS — I10 Essential (primary) hypertension: Secondary | ICD-10-CM

## 2012-07-17 DIAGNOSIS — I259 Chronic ischemic heart disease, unspecified: Secondary | ICD-10-CM

## 2012-07-17 DIAGNOSIS — E785 Hyperlipidemia, unspecified: Secondary | ICD-10-CM

## 2012-07-17 LAB — LIPID PANEL
HDL: 37 mg/dL — ABNORMAL LOW (ref >39.00)
Total CHOL/HDL Ratio: 5
Triglycerides: 273 mg/dL — ABNORMAL HIGH (ref 0.0–149.0)
VLDL: 54.6 mg/dL — ABNORMAL HIGH (ref 0.0–40.0)

## 2012-07-17 LAB — BASIC METABOLIC PANEL
CO2: 28 mEq/L (ref 19–32)
Glucose, Bld: 89 mg/dL (ref 70–99)
Potassium: 5.2 mEq/L — ABNORMAL HIGH (ref 3.5–5.1)
Sodium: 141 mEq/L (ref 135–145)

## 2012-07-17 LAB — HEPATIC FUNCTION PANEL: Total Bilirubin: 1 mg/dL (ref 0.3–1.2)

## 2012-07-17 MED ORDER — NITROGLYCERIN 0.4 MG SL SUBL: 0.4000 mg | SUBLINGUAL_TABLET | SUBLINGUAL | Status: DC | PRN

## 2012-07-17 NOTE — Progress Notes (Signed)
Devon [name] Date of Birth:  09-08-1940 [facility] [address] [place], [place]  [address] [phone]  Fax   [phone]  HPI: This pleasant 72 year old is seen for a scheduled followup office visit. He has a history of known ischemic heart disease. He had remote CABG in 1991 with a LIMA to the LAD. In 2013 he developed a decrease in exercise tolerance and new anterior T wave changes on his EKG and underwent cardiac catheterization with subsequent DES to the mid circumflex. His ejection fraction was 50%. He has a history of dyslipidemia but is intolerant to statins and also to ezetimibe. Since last visit he has had no new cardiac symptoms.  And he has history of labile hypertension.  At home his blood pressures run in the 130s systolic   Current Outpatient Prescriptions  Medication Sig Dispense Refill  . allopurinol (ZYLOPRIM) 100 MG tablet Take 100 mg by mouth 2 (two) times daily.      . benazepril (LOTENSIN) 10 MG tablet Take 10 mg by mouth daily.      . Cholecalciferol (VITAMIN D3) 2000 UNITS TABS Take by mouth daily.      . clopidogrel (PLAVIX) 75 MG tablet Take 1 tablet (75 mg total) by mouth at bedtime.  90 tablet  3  . Coenzyme Q10 (CO Q 10 PO) Take by mouth daily.      . fish oil-omega-3 fatty acids 1000 MG capsule Take 1 g by mouth daily.      . Flaxseed, Linseed, (FLAX SEED OIL PO) Take by mouth. Taking 1300 daily      . metoprolol succinate (TOPROL-XL) 25 MG 24 hr tablet Take 1/2 tablet daily  45 tablet  3  . aspirin 81 MG chewable tablet Chew 1 tablet (81 mg total) by mouth daily.      . nitroGLYCERIN (NITROSTAT) 0.4 MG SL tablet Place 1 tablet (0.4 mg total) under the tongue every 5 (five) minutes x 3 doses as needed for chest pain (up to 3 doses).  25 tablet  prn   No current facility-administered medications for this visit.    Allergies  Allergen Reactions  . Metoprolol     Chest pressure  . Statins     Myalgias  . Zetia (Ezetimibe)    Myalgias    Patient Active Problem List   Diagnosis Date Noted  . HLD (hyperlipidemia) 07/11/2011  . CAD (coronary artery disease) 07/04/2011  . HTN (hypertension) 07/04/2011  . Bilateral plantar fasciitis   . H/O: gout     History  Smoking status  . Never Smoker   Smokeless tobacco  . Not on file    History  Alcohol Use  . Yes    Comment: Occassional Use    No family history on file.  Review of Systems: The patient denies any heat or cold intolerance.  No weight gain or weight loss.  The patient denies headaches or blurry vision.  There is no cough or sputum production.  The patient denies dizziness.  There is no hematuria or hematochezia.  The patient denies any muscle aches or arthritis.  The patient denies any rash.  The patient denies frequent falling or instability.  There is no history of depression or anxiety.  All other systems were reviewed and are negative.   Physical Exam: Filed Vitals:   07/17/12 0836  BP: 146/78  Pulse: 62   the general appearance reveals a well-developed well-nourished gentleman in no distress.The head and neck exam reveals pupils  equal and reactive.  Extraocular movements are full.  There is no scleral icterus.  The mouth and pharynx are normal.  The neck is supple.  The carotids reveal no bruits.  The jugular venous pressure is normal.  The  thyroid is not enlarged.  There is no lymphadenopathy.  The chest is clear to percussion and auscultation.  There are no rales or rhonchi.  Expansion of the chest is symmetrical.  The precordium is quiet.  The first heart sound is normal.  The second heart sound is physiologically split.  There is no murmur gallop rub or click.  There is no abnormal lift or heave.  The abdomen is soft and nontender.  The bowel sounds are normal.  The liver and spleen are not enlarged.  There are no abdominal masses.  There are no abdominal bruits.  Extremities reveal good pedal pulses.  There is no phlebitis or edema.  There  is no cyanosis or clubbing.  Strength is normal and symmetrical in all extremities.  There is no lateralizing weakness.  There are no sensory deficits.  The skin is warm and dry.  There is no rash.  EKG shows normal sinus rhythm with nonspecific ST-T wave changes in the anterior leads, improved since 07/11/11    Assessment / Plan: Continue on same medication.  I encouraged him to continue on Plavix for long-term use. Recheck in 6 months for followup office visit and fasting lab work.  Blood work today pending

## 2012-07-17 NOTE — Assessment & Plan Note (Signed)
Patient exercises 5 days a week at the [facility] he is not having symptoms of angina.

## 2012-07-17 NOTE — Patient Instructions (Signed)
Will obtain labs today and call you with the results (lp/bmet/hfp)  Your physician recommends that you continue on your current medications as directed. Please refer to the Current Medication list given to you today.  Your physician wants you to follow-up in: 6 months with fasting labs (lp/bmet/hfp)  You will receive a reminder letter in the mail two months in advance. If you don't receive a letter, please call our office to schedule the follow-up appointment.  

## 2012-07-17 NOTE — Progress Notes (Signed)
Quick Note:  Please report to patient. The recent labs are stable. Continue same medication and careful diet. The lipids are better. ______ 

## 2012-07-17 NOTE — Assessment & Plan Note (Signed)
We are checking fasting lab work today. 

## 2012-07-17 NOTE — Assessment & Plan Note (Signed)
No headaches.  No dizzy spells.

## 2012-07-17 NOTE — Assessment & Plan Note (Signed)
No recent flareup of gout.

## 2012-07-18 ENCOUNTER — Telehealth: Payer: Self-pay | Admitting: *Deleted

## 2012-07-18 NOTE — Telephone Encounter (Signed)
Message copied by Burnell Blanks on Thu Jul 18, 2012  1:04 PM ------      Message from: Cassell Clement      Created: Wed Jul 17, 2012  8:00 PM       Please report to patient.  The recent labs are stable. Continue same medication and careful diet.  The lipids are better. ------

## 2012-07-18 NOTE — Telephone Encounter (Signed)
Advised patient of lab results  

## 2012-10-09 ENCOUNTER — Other Ambulatory Visit: Payer: Self-pay

## 2012-10-21 ENCOUNTER — Other Ambulatory Visit: Payer: Self-pay | Admitting: Cardiology

## 2012-12-09 ENCOUNTER — Other Ambulatory Visit: Payer: Self-pay | Admitting: Cardiology

## 2012-12-11 ENCOUNTER — Telehealth: Payer: Self-pay | Admitting: Cardiology

## 2012-12-11 NOTE — Telephone Encounter (Signed)
New message   Want lab work before nov appt

## 2012-12-11 NOTE — Telephone Encounter (Signed)
Spoke with patient and he had labs at PCP in August, he will just bring with him to November. Advised patient

## 2013-01-09 ENCOUNTER — Other Ambulatory Visit: Payer: Self-pay

## 2013-01-20 ENCOUNTER — Ambulatory Visit (INDEPENDENT_AMBULATORY_CARE_PROVIDER_SITE_OTHER): Payer: Medicare Other | Admitting: Cardiology

## 2013-01-20 ENCOUNTER — Other Ambulatory Visit: Payer: Medicare Other

## 2013-01-20 ENCOUNTER — Encounter: Payer: Self-pay | Admitting: Cardiology

## 2013-01-20 VITALS — BP 154/87 | HR 74 | Ht 74.0 in | Wt 187.8 lb

## 2013-01-20 DIAGNOSIS — I10 Essential (primary) hypertension: Secondary | ICD-10-CM

## 2013-01-20 DIAGNOSIS — E785 Hyperlipidemia, unspecified: Secondary | ICD-10-CM

## 2013-01-20 DIAGNOSIS — I2510 Atherosclerotic heart disease of native coronary artery without angina pectoris: Secondary | ICD-10-CM

## 2013-01-20 NOTE — Assessment & Plan Note (Signed)
The patient brought with him results of labs drawn by his PCP on 10/30/12 which we reviewed today.

## 2013-01-20 NOTE — Patient Instructions (Signed)
Your physician wants you to follow-up in: 6 months with Dr. Brackbill. You will receive a reminder letter in the mail two months in advance. If you don't receive a letter, please call our office to schedule the follow-up appointment.  Your physician recommends that you continue on your current medications as directed. Please refer to the Current Medication list given to you today.  

## 2013-01-20 NOTE — Assessment & Plan Note (Signed)
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  He brought a long list of home blood pressure readings which are acceptable.

## 2013-01-20 NOTE — Assessment & Plan Note (Signed)
The patient has not been experiencing any recurrent chest pain or angina. 

## 2013-01-20 NOTE — Progress Notes (Signed)
Devon [name] Date of Birth:  Nov 22, 1940 [address] [place], [place]  [address] [phone]  Fax   [phone]  HPI: This pleasant 72 year old is seen for a scheduled followup office visit. Devon [name] has a history of known ischemic heart disease. Devon [name] had remote CABG in 1991 with a LIMA to the LAD. In 2013 Devon [name] developed a decrease in exercise tolerance and new anterior T wave changes on his EKG and underwent cardiac catheterization with subsequent DES to the mid circumflex. His ejection fraction was 50%. Devon [name] has a history of dyslipidemia but is intolerant to statins and also to ezetimibe. Since last visit Devon [name] has had no new cardiac symptoms.  And Devon [name] has history of labile hypertension.  At home his blood pressures run in the 130s systolic recently Devon [name] has been experiencing some dizziness which she attributes to either his Plavix or to his beta blocker.   Current Outpatient Prescriptions  Medication Sig Dispense Refill  . allopurinol (ZYLOPRIM) 100 MG tablet Take 100 mg by mouth 2 (two) times daily.      Marland Kitchen aspirin 81 MG chewable tablet Chew 1 tablet (81 mg total) by mouth daily.      . benazepril (LOTENSIN) 10 MG tablet Take 10 mg by mouth daily.      . Cholecalciferol (VITAMIN D3) 2000 UNITS TABS Take by mouth daily.      . clopidogrel (PLAVIX) 75 MG tablet TAKE ONE TABLET BY MOUTH AT BEDTIME  90 tablet  0  . Coenzyme Q10 (CO Q 10 PO) Take 1 tablet by mouth daily.       . fish oil-omega-3 fatty acids 1000 MG capsule Take 1 g by mouth daily.      . Flaxseed, Linseed, (FLAX SEED OIL PO) Take by mouth. Taking 1300 daily      . metoprolol succinate (TOPROL-XL) 25 MG 24 hr tablet TAKE HALF TABLET BY MOUTH DAILY  45 tablet  3  . nitroGLYCERIN (NITROSTAT) 0.4 MG SL tablet Place 1 tablet (0.4 mg total) under the tongue every 5 (five) minutes x 3 doses as needed for chest pain (up to 3 doses).  25 tablet  prn   No current facility-administered medications for this visit.    Allergies    Allergen Reactions  . Metoprolol     Chest pressure  . Statins     Myalgias  . Zetia [Ezetimibe]     Myalgias    Patient Active Problem List   Diagnosis Date Noted  . HLD (hyperlipidemia) 07/11/2011  . CAD (coronary artery disease) 07/04/2011  . HTN (hypertension) 07/04/2011  . Bilateral plantar fasciitis   . H/O: gout     History  Smoking status  . Never Smoker   Smokeless tobacco  . Not on file    History  Alcohol Use  . Yes    Comment: Occassional Use    No family history on file.  Review of Systems: The patient denies any heat or cold intolerance.  No weight gain or weight loss.  The patient denies headaches or blurry vision.  There is no cough or sputum production.  The patient denies dizziness.  There is no hematuria or hematochezia.  The patient denies any muscle aches or arthritis.  The patient denies any rash.  The patient denies frequent falling or instability.  There is no history of depression or anxiety.  All other systems were reviewed and are negative.   Physical Exam: Filed Vitals:   01/20/13 0938  BP: 154/87  Pulse: 74   the general appearance reveals a well-developed well-nourished gentleman in no distress.The head and neck exam reveals pupils equal and reactive.  Extraocular movements are full.  There is no scleral icterus.  The mouth and pharynx are normal.  The neck is supple.  The carotids reveal no bruits.  The jugular venous pressure is normal.  The  thyroid is not enlarged.  There is no lymphadenopathy.  The chest is clear to percussion and auscultation.  There are no rales or rhonchi.  Expansion of the chest is symmetrical.  The precordium is quiet.  The first heart sound is normal.  The second heart sound is physiologically split.  There is no murmur gallop rub or click.  There is no abnormal lift or heave.  The abdomen is soft and nontender.  The bowel sounds are normal.  The liver and spleen are not enlarged.  There are no abdominal masses.   There are no abdominal bruits.  Extremities reveal good pedal pulses.  There is no phlebitis or edema.  There is no cyanosis or clubbing.  Strength is normal and symmetrical in all extremities.  There is no lateralizing weakness.  There are no sensory deficits.  The skin is warm and dry.  There is no rash.  EKG shows normal sinus rhythm with nonspecific ST-T wave changes in the anterior leads, improved since 07/17/12    Assessment / Plan: Continue on same medication.  We will try cutting back on his Plavix to just every other day and see if his symptoms of dizziness improve.  If no change with reduction of Plavix, Devon [name] will try cutting back on his beta blocker further and see if this makes a difference. Recheck in 6 months for followup office visit and fasting lab work.  Blood work today pending

## 2013-02-11 ENCOUNTER — Encounter: Payer: Self-pay | Admitting: Cardiology

## 2013-03-11 ENCOUNTER — Other Ambulatory Visit: Payer: Self-pay | Admitting: Cardiology

## 2013-06-07 ENCOUNTER — Other Ambulatory Visit: Payer: Self-pay | Admitting: Cardiology

## 2013-07-15 ENCOUNTER — Encounter: Payer: Self-pay | Admitting: Cardiology

## 2013-07-15 ENCOUNTER — Ambulatory Visit (INDEPENDENT_AMBULATORY_CARE_PROVIDER_SITE_OTHER): Payer: Medicare Other | Admitting: Cardiology

## 2013-07-15 VITALS — BP 158/88 | HR 68 | Ht 74.0 in | Wt 193.0 lb

## 2013-07-15 DIAGNOSIS — I2510 Atherosclerotic heart disease of native coronary artery without angina pectoris: Secondary | ICD-10-CM

## 2013-07-15 DIAGNOSIS — E785 Hyperlipidemia, unspecified: Secondary | ICD-10-CM

## 2013-07-15 DIAGNOSIS — Z8739 Personal history of other diseases of the musculoskeletal system and connective tissue: Secondary | ICD-10-CM

## 2013-07-15 DIAGNOSIS — I10 Essential (primary) hypertension: Secondary | ICD-10-CM

## 2013-07-15 DIAGNOSIS — Z8639 Personal history of other endocrine, nutritional and metabolic disease: Secondary | ICD-10-CM

## 2013-07-15 DIAGNOSIS — Z862 Personal history of diseases of the blood and blood-forming organs and certain disorders involving the immune mechanism: Secondary | ICD-10-CM

## 2013-07-15 DIAGNOSIS — I119 Hypertensive heart disease without heart failure: Secondary | ICD-10-CM

## 2013-07-15 NOTE — Progress Notes (Signed)
Devon [name] Date of Birth:  11/16/1940 [facility] [address] [place], [place]  [id number] [phone]        Fax   [phone]   History of Present Illness: This pleasant 73 year old is seen for a scheduled followup office visit. He has a history of known ischemic heart disease. He had remote CABG in 1991 with a LIMA to the LAD. In 2013 he developed a decrease in exercise tolerance and new anterior T wave changes on his EKG and underwent cardiac catheterization with subsequent DES to the mid circumflex. His ejection fraction was 50%. He has a history of dyslipidemia but is intolerant to statins and also to ezetimibe. Since last visit he has had no new cardiac symptoms. And he has history of labile hypertension. At home his blood pressures run in the 130s systolic.   Current Outpatient Prescriptions  Medication Sig Dispense Refill  . allopurinol (ZYLOPRIM) 100 MG tablet Take 100 mg by mouth 2 (two) times daily.      Marland Kitchen. aspirin 81 MG chewable tablet Chew 1 tablet (81 mg total) by mouth daily.      . benazepril (LOTENSIN) 10 MG tablet Take 10 mg by mouth daily.      . Cholecalciferol (VITAMIN D3) 2000 UNITS TABS Take by mouth daily.      . clopidogrel (PLAVIX) 75 MG tablet TAKE 1 TABLET BY MOUTH EVERY OTHER DAY  45 tablet  6  . Coenzyme Q10 (CO Q 10 PO) Take 1 tablet by mouth daily.       . fish oil-omega-3 fatty acids 1000 MG capsule Take 1 g by mouth daily.      . Flaxseed, Linseed, (FLAX SEED OIL PO) Take by mouth. Taking 1300 daily      . metoprolol succinate (TOPROL-XL) 25 MG 24 hr tablet TAKE HALF TABLET BY MOUTH DAILY  45 tablet  3  . nitroGLYCERIN (NITROSTAT) 0.4 MG SL tablet Place 1 tablet (0.4 mg total) under the tongue every 5 (five) minutes x 3 doses as needed for chest pain (up to 3 doses).  25 tablet  prn   No current facility-administered medications for this visit.    Allergies  Allergen Reactions  . Metoprolol     Chest pressure  .  Statins     Myalgias  . Zetia [Ezetimibe]     Myalgias    Patient Active Problem List   Diagnosis Date Noted  . HLD (hyperlipidemia) 07/11/2011  . CAD (coronary artery disease) 07/04/2011  . HTN (hypertension) 07/04/2011  . Bilateral plantar fasciitis   . H/O: gout     History  Smoking status  . Never Smoker   Smokeless tobacco  . Not on file    History  Alcohol Use  . Yes    Comment: Occassional Use    No family history on file.  Review of Systems: Constitutional: no fever chills diaphoresis or fatigue or change in weight.  Head and neck: no hearing loss, no epistaxis, no photophobia or visual disturbance. Respiratory: No cough, shortness of breath or wheezing. Cardiovascular: No chest pain peripheral edema, palpitations. Gastrointestinal: No abdominal distention, no abdominal pain, no change in bowel habits hematochezia or melena. Genitourinary: No dysuria, no frequency, no urgency, no nocturia. Musculoskeletal:No arthralgias, no back pain, no gait disturbance or myalgias. Neurological: No dizziness, no headaches, no numbness, no seizures, no syncope, no weakness, no tremors. Hematologic: No lymphadenopathy, no easy bruising. Psychiatric: No confusion, no hallucinations, no sleep disturbance.  Physical Exam: Filed Vitals:   07/15/13 1102  BP: 158/88  Pulse: 68     Assessment / Plan: 1.  Ischemic heart disease status post CABG in 1991 with a LIMA to the LAD and status post cardiac catheterization 2013 with drug-eluting stent to the mid circumflex.  2. Essential hypertension 3. Hyperlipidemia  Plan: Continue same medication.  Recheck in 6 months for office visit and EKG.  He will be getting fasting lab work with his annual physical with Dr. Jacky KindleAronson this summer.

## 2013-07-15 NOTE — Assessment & Plan Note (Signed)
Patient has a history of dyslipidemia followed by his PCP 

## 2013-07-15 NOTE — Patient Instructions (Signed)
Your physician recommends that you continue on your current medications as directed. Please refer to the Current Medication list given to you today.  Your physician wants you to follow-up in: 6 month ov/ekg You will receive a reminder letter in the mail two months in advance. If you don't receive a letter, please call our office to schedule the follow-up appointment.  

## 2013-07-15 NOTE — Assessment & Plan Note (Signed)
The patient has not had any recurrent chest pain or angina.  [facility] and works out vigorously.

## 2013-07-15 NOTE — Assessment & Plan Note (Signed)
Blood pressure here in the office is slightly elevated consistent with white coat syndrome.  He brought in a list of his home blood pressure readings which are satisfactory.

## 2013-10-11 ENCOUNTER — Other Ambulatory Visit: Payer: Self-pay | Admitting: Cardiology

## 2013-10-20 ENCOUNTER — Observation Stay (HOSPITAL_COMMUNITY)
Admission: EM | Admit: 2013-10-20 | Discharge: 2013-10-21 | Disposition: A | Discharge status: Home / Self Care | Payer: Medicare Other | Attending: Cardiology | Admitting: Cardiology

## 2013-10-20 ENCOUNTER — Emergency Department (HOSPITAL_COMMUNITY): Payer: Medicare Other

## 2013-10-20 ENCOUNTER — Telehealth: Payer: Self-pay | Admitting: Cardiology

## 2013-10-20 ENCOUNTER — Encounter (HOSPITAL_COMMUNITY): Payer: Self-pay | Admitting: Emergency Medicine

## 2013-10-20 DIAGNOSIS — M109 Gout, unspecified: Secondary | ICD-10-CM | POA: Diagnosis not present

## 2013-10-20 DIAGNOSIS — Z9861 Coronary angioplasty status: Secondary | ICD-10-CM | POA: Insufficient documentation

## 2013-10-20 DIAGNOSIS — R0602 Shortness of breath: Secondary | ICD-10-CM | POA: Diagnosis not present

## 2013-10-20 DIAGNOSIS — E785 Hyperlipidemia, unspecified: Secondary | ICD-10-CM | POA: Diagnosis present

## 2013-10-20 DIAGNOSIS — I10 Essential (primary) hypertension: Secondary | ICD-10-CM | POA: Diagnosis not present

## 2013-10-20 DIAGNOSIS — R079 Chest pain, unspecified: Secondary | ICD-10-CM | POA: Diagnosis not present

## 2013-10-20 DIAGNOSIS — Z951 Presence of aortocoronary bypass graft: Secondary | ICD-10-CM | POA: Insufficient documentation

## 2013-10-20 DIAGNOSIS — I2510 Atherosclerotic heart disease of native coronary artery without angina pectoris: Secondary | ICD-10-CM | POA: Diagnosis not present

## 2013-10-20 DIAGNOSIS — D696 Thrombocytopenia, unspecified: Secondary | ICD-10-CM

## 2013-10-20 DIAGNOSIS — I209 Angina pectoris, unspecified: Secondary | ICD-10-CM

## 2013-10-20 DIAGNOSIS — I200 Unstable angina: Secondary | ICD-10-CM

## 2013-10-20 DIAGNOSIS — I25810 Atherosclerosis of coronary artery bypass graft(s) without angina pectoris: Secondary | ICD-10-CM

## 2013-10-20 HISTORY — DX: Thrombocytopenia, unspecified: D69.6

## 2013-10-20 LAB — COMPREHENSIVE METABOLIC PANEL
ALK PHOS: 92 U/L (ref 39–117)
ALT: 45 U/L (ref 0–53)
AST: 43 U/L — AB (ref 0–37)
Albumin: 4.1 g/dL (ref 3.5–5.2)
Anion gap: 13 (ref 5–15)
BILIRUBIN TOTAL: 0.7 mg/dL (ref 0.3–1.2)
BUN: 13 mg/dL (ref 6–23)
CHLORIDE: 102 meq/L (ref 96–112)
CO2: 24 meq/L (ref 19–32)
Calcium: 9.3 mg/dL (ref 8.4–10.5)
Creatinine, Ser: 0.97 mg/dL (ref 0.50–1.35)
GFR calc Af Amer: >90 mL/min (ref >90)
GFR calc non Af Amer: 79 mL/min — ABNORMAL LOW (ref >90)
Glucose, Bld: 94 mg/dL (ref 70–99)
Potassium: 4 mEq/L (ref 3.7–5.3)
SODIUM: 139 meq/L (ref 137–147)
Total Protein: 6.7 g/dL (ref 6.0–8.3)

## 2013-10-20 LAB — CBC
HCT: 46.2 % (ref 39.0–52.0)
Hemoglobin: 16.7 g/dL (ref 13.0–17.0)
MCH: 32.1 pg (ref 26.0–34.0)
MCHC: 36.1 g/dL — ABNORMAL HIGH (ref 30.0–36.0)
MCV: 88.8 fL (ref 78.0–100.0)
Platelets: 110 K/uL — ABNORMAL LOW (ref 150–400)
RBC: 5.2 MIL/uL (ref 4.22–5.81)
RDW: 13.5 % (ref 11.5–15.5)
WBC: 8.3 K/uL (ref 4.0–10.5)

## 2013-10-20 LAB — TROPONIN I
Troponin I: <0.3 ng/mL
Troponin I: <0.3 ng/mL (ref <0.30)

## 2013-10-20 IMAGING — CR DG CHEST 2V
2 series · 2 of 2 positions shown · non-contrast
Comparison: [DATE]

CLINICAL DATA: Chest pain, history of CABG and coronary artery
stent placement

EXAM:
CHEST  2 VIEW

[w chest pa]
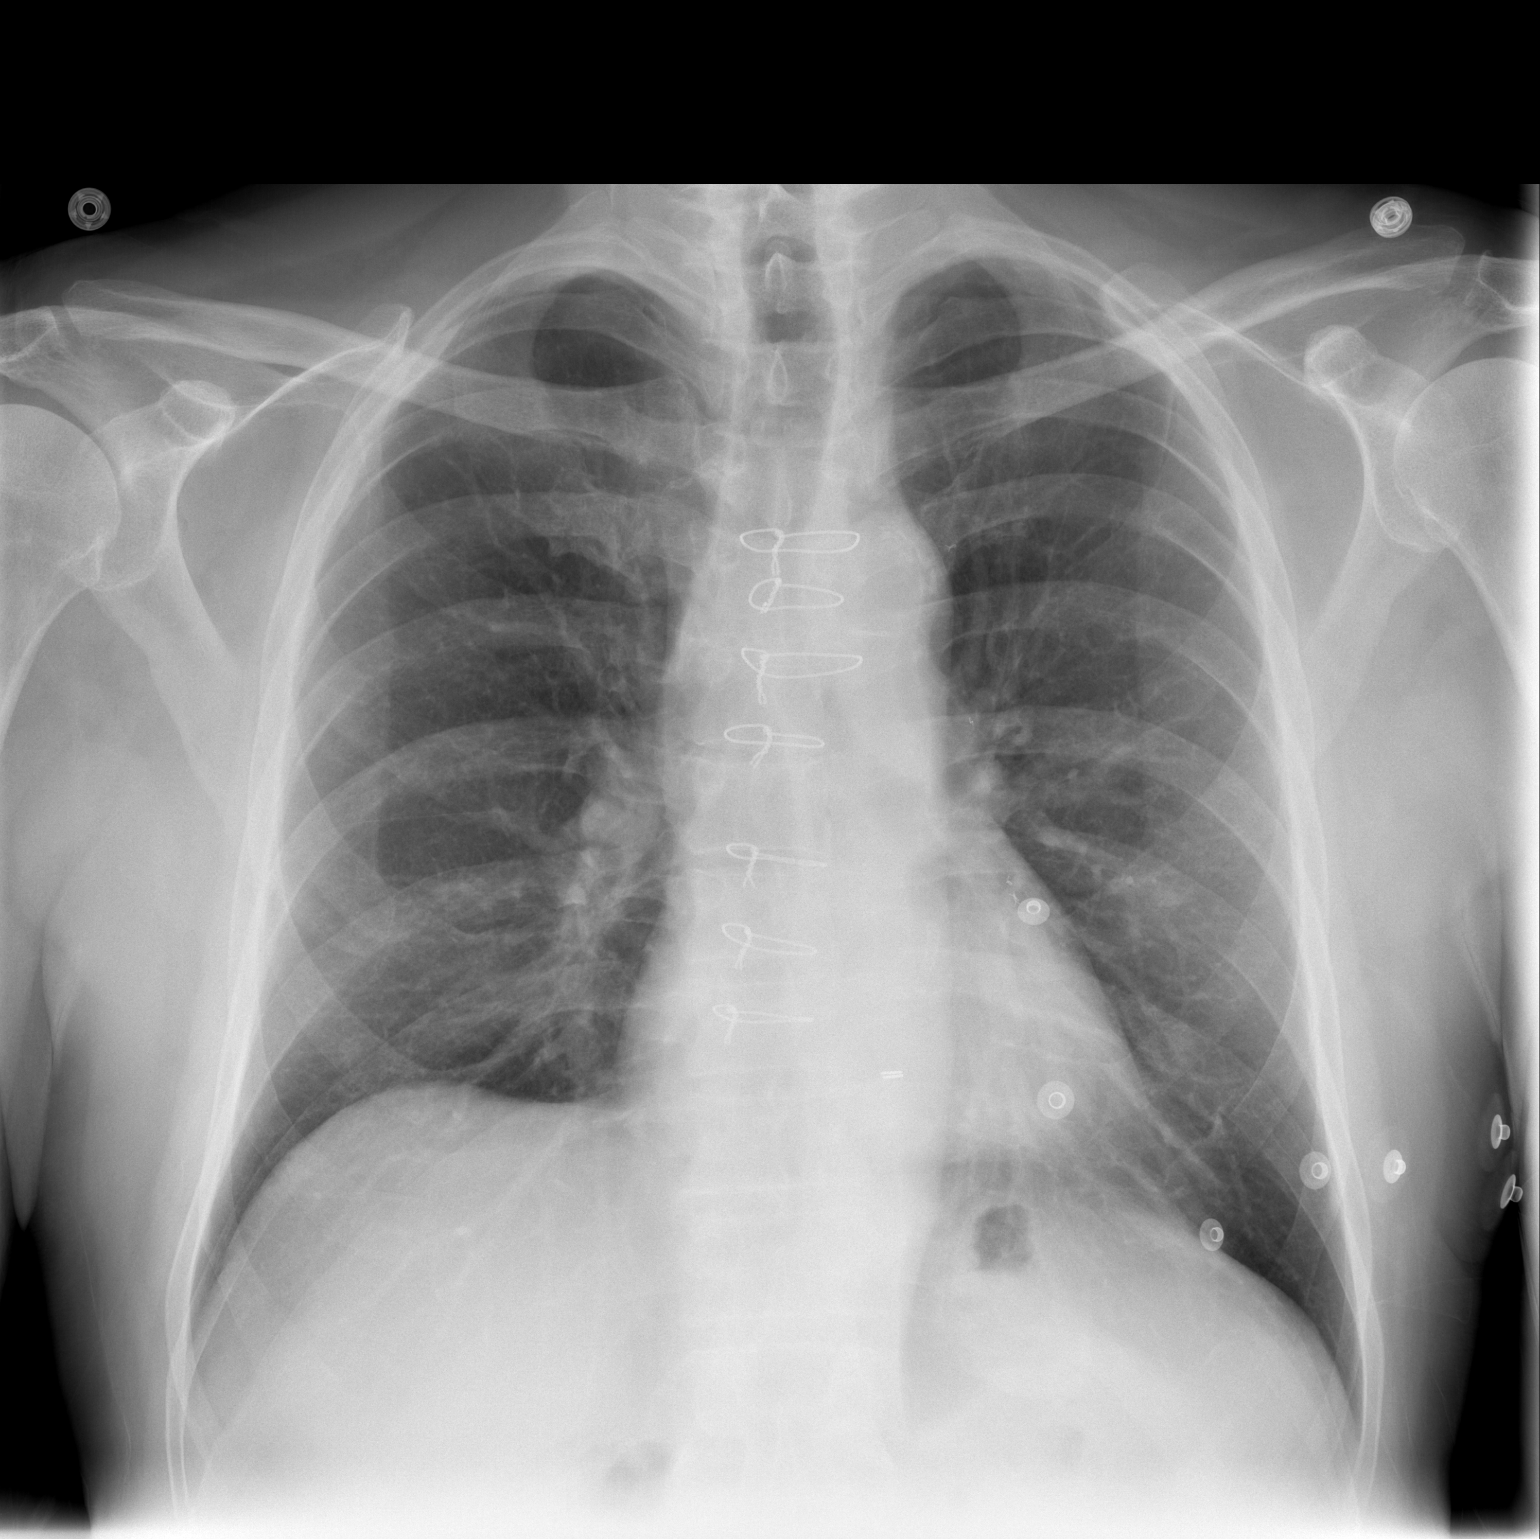

[w chest lat]
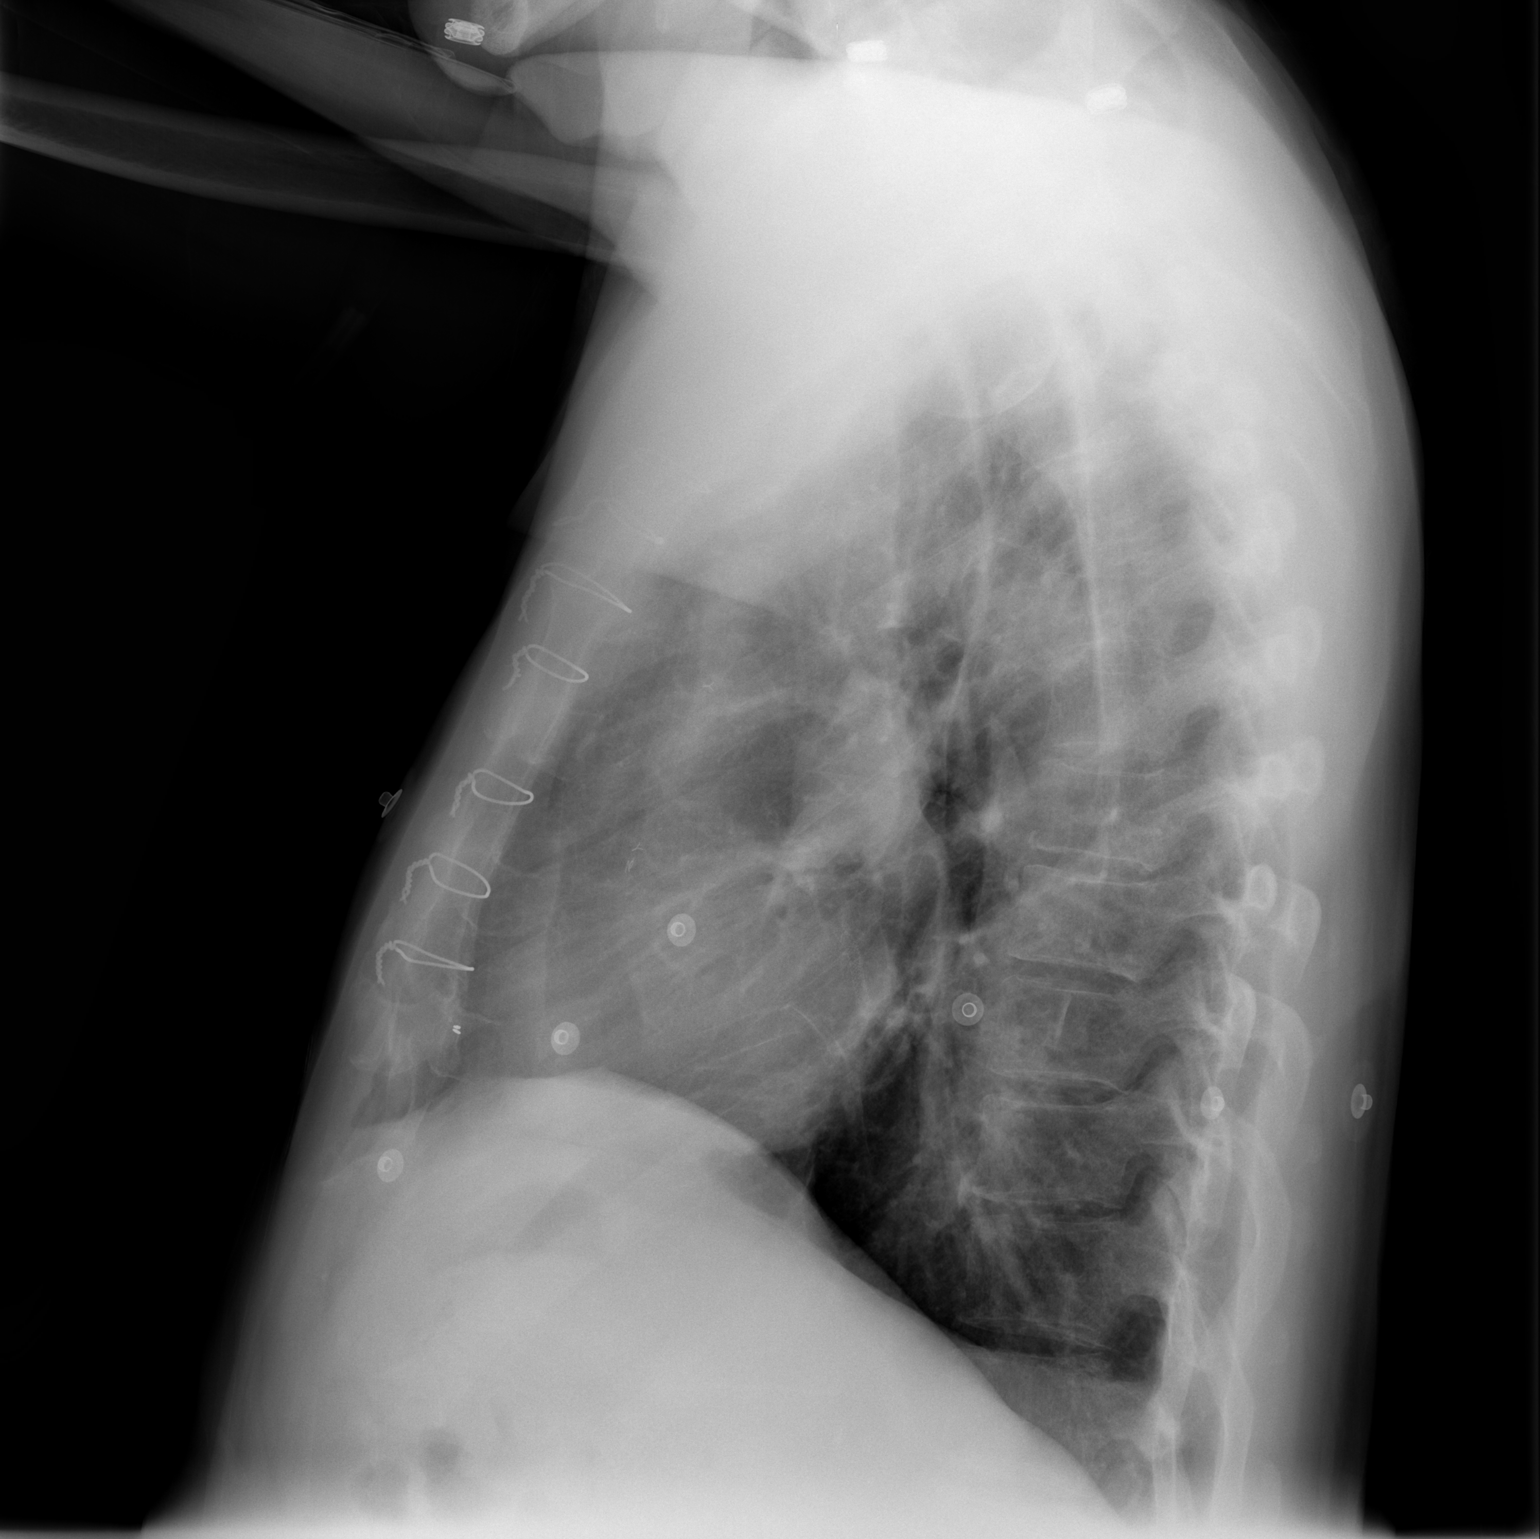

[2 of 2 positions shown; findings below may reference images not displayed]

FINDINGS: Grossly unchanged cardiac silhouette and mediastinal contours post
median sternotomy. Evaluation of the retrosternal clear space is
obscured secondary to overlying soft tissues. No focal airspace
opacities. There is mild elevation of the right hemidiaphragm. No
pleural effusion or pneumothorax. No evidence of edema. No acute
osseus abnormalities.
IMPRESSION: No acute cardiopulmonary disease.

## 2013-10-20 MED ORDER — HYDROCODONE-ACETAMINOPHEN 5-325 MG PO TABS: 1.0000 | ORAL_TABLET | Freq: Four times a day (QID) | ORAL | Status: DC | PRN

## 2013-10-20 MED ORDER — NITROGLYCERIN 0.4 MG SL SUBL: 0.4000 mg | SUBLINGUAL_TABLET | SUBLINGUAL | Status: DC | PRN

## 2013-10-20 MED ORDER — METOPROLOL SUCCINATE 12.5 MG HALF TABLET
12.5000 mg | ORAL_TABLET | Freq: Every day | ORAL | Status: DC
  Administered 2013-10-21: 12.5 mg via ORAL
  Filled 2013-10-20: qty 1

## 2013-10-20 MED ORDER — ONDANSETRON HCL 4 MG/2ML IJ SOLN: 4.0000 mg | Freq: Four times a day (QID) | INTRAMUSCULAR | Status: DC | PRN

## 2013-10-20 MED ORDER — ASPIRIN 81 MG PO CHEW
81.0000 mg | CHEWABLE_TABLET | ORAL | Status: AC
  Administered 2013-10-21: 81 mg via ORAL
  Filled 2013-10-20: qty 1

## 2013-10-20 MED ORDER — SODIUM CHLORIDE 0.9 % IJ SOLN: 3.0000 mL | Freq: Two times a day (BID) | INTRAMUSCULAR | Status: DC

## 2013-10-20 MED ORDER — OMEGA-3 FATTY ACIDS 1000 MG PO CAPS: 1.0000 g | ORAL_CAPSULE | Freq: Every day | ORAL | Status: DC

## 2013-10-20 MED ORDER — CLOPIDOGREL BISULFATE 75 MG PO TABS: 75.0000 mg | ORAL_TABLET | ORAL | Status: DC

## 2013-10-20 MED ORDER — OMEGA-3-ACID ETHYL ESTERS 1 G PO CAPS
1.0000 g | ORAL_CAPSULE | Freq: Every day | ORAL | Status: DC
  Administered 2013-10-21: 1 g via ORAL
  Filled 2013-10-20: qty 1

## 2013-10-20 MED ORDER — HEPARIN BOLUS VIA INFUSION
4000.0000 [IU] | Freq: Once | INTRAVENOUS | Status: AC
  Administered 2013-10-20: 4000 [IU] via INTRAVENOUS
  Filled 2013-10-20: qty 4000

## 2013-10-20 MED ORDER — ASPIRIN EC 81 MG PO TBEC
81.0000 mg | DELAYED_RELEASE_TABLET | Freq: Every day | ORAL | Status: DC
Start: 2013-10-21 — End: 2013-10-21
  Filled 2013-10-20: qty 1

## 2013-10-20 MED ORDER — SODIUM CHLORIDE 0.9 % IV SOLN: 250.0000 mL | INTRAVENOUS | Status: DC | PRN

## 2013-10-20 MED ORDER — SODIUM CHLORIDE 0.9 % IV SOLN
1.0000 mL/kg/h | INTRAVENOUS | Status: DC
  Administered 2013-10-21: 1 mL/kg/h via INTRAVENOUS

## 2013-10-20 MED ORDER — BENAZEPRIL HCL 10 MG PO TABS
10.0000 mg | ORAL_TABLET | Freq: Every day | ORAL | Status: DC
  Administered 2013-10-21: 10 mg via ORAL
  Filled 2013-10-20: qty 1

## 2013-10-20 MED ORDER — ACETAMINOPHEN 325 MG PO TABS: 650.0000 mg | ORAL_TABLET | ORAL | Status: DC | PRN

## 2013-10-20 MED ORDER — HEPARIN (PORCINE) IN NACL 100-0.45 UNIT/ML-% IJ SOLN
1200.0000 [IU]/h | INTRAMUSCULAR | Status: DC
  Administered 2013-10-20: 1200 [IU]/h via INTRAVENOUS
  Filled 2013-10-20: qty 250

## 2013-10-20 MED ORDER — ALLOPURINOL 100 MG PO TABS
200.0000 mg | ORAL_TABLET | Freq: Every day | ORAL | Status: DC
  Administered 2013-10-21: 200 mg via ORAL
  Filled 2013-10-20 (×2): qty 2

## 2013-10-20 MED ORDER — SODIUM CHLORIDE 0.9 % IJ SOLN: 3.0000 mL | INTRAMUSCULAR | Status: DC | PRN

## 2013-10-20 NOTE — ED Provider Notes (Signed)
CSN: [id number]     Arrival date & time 10/20/13  1316 History   First MD Initiated Contact with Patient 10/20/13 1322     Chief Complaint  Patient presents with  . Chest Pain   HPI Mr. Devon [name] is a 73 yo man with a PMH of HTN, hyperlipidemia and ischemic heart disease (CABG in 1991, catheterization with stent DES to mid LCX, EF 50% in 2013) who is presenting with chest pressure over the last 2 weeks. The pressure comes and goes, but is located in the center of his chest and is accompanied with some shortness of breath. He has also noted that his heart is skipping beats, which is farily normal for him. There is no pain. Of note, he has been exerting himself more than usual in the yard and at the gym over this time period. The pressure was worst yesterday, at which time it lasted for an hour when he was relaxing, and again today, at which time it was following exertion and lasted about 1.5 hours (subsided with nitro).  Past Medical History  Diagnosis Date  . Coronary artery disease     s/p CABG 1991 with LIMA to LAD, s/p DES to mid LCX 06/2011  . Hypertension   . Hyperlipidemia     statin intolerant; intolerant to zetia  . Bilateral plantar fasciitis   . H/O: gout    Past Surgical History  Procedure Laterality Date  . Coronary artery bypass graft  1991    severe 90% ostial LAD lesion with LIMA to LAD  . Cardiac catheterization  April 2013    DES to mid LCX; EF of 50%.    No family history on file. History  Substance Use Topics  . Smoking status: Never Smoker   . Smokeless tobacco: Not on file  . Alcohol Use: Yes     Comment: Occassional Use    Review of Systems General: has been more active than normal, no recent illness Skin: no rashes or lesions HEENT: no headaches, no changes in vision or hearing Cardiac: see HPI for pressure and palpitations, no chest pain Respiratory: see HPI for SOB, no wheezes GI: no changes in BMs, no abdominal pain Urinary: no changes Msk: some  muscle strain / pain Endocrine: no temperature intolerance Psychiatric: no recent stressors   Allergies  Metoprolol; Statins; and Zetia  Home Medications   Prior to Admission medications   Medication Sig Start Date End Date Taking? Authorizing Provider  allopurinol (ZYLOPRIM) 100 MG tablet Take 200 mg by mouth every morning.    Yes Historical Provider, MD  aspirin EC 81 MG tablet Take 81 mg by mouth daily.   Yes Historical Provider, MD  benazepril (LOTENSIN) 10 MG tablet Take 10 mg by mouth daily.   Yes Historical Provider, MD  clopidogrel (PLAVIX) 75 MG tablet Take 75 mg by mouth every other day.   Yes Historical Provider, MD  desonide (DESOWEN) 0.05 % cream Apply 1 application topically daily as needed (for spots).   Yes Historical Provider, MD  fish oil-omega-3 fatty acids 1000 MG capsule Take 1 g by mouth daily.   Yes Historical Provider, MD  metoprolol succinate (TOPROL-XL) 25 MG 24 hr tablet Take 12.5 mg by mouth daily.   Yes Historical Provider, MD  nitroGLYCERIN (NITROSTAT) 0.4 MG SL tablet Place 1 tablet (0.4 mg total) under the tongue every 5 (five) minutes x 3 doses as needed for chest pain (up to 3 doses). 07/17/12 10/20/13 Yes Cassell Clement, MD  BP 132/64  Pulse 67  Temp(Src) 98.1 F (36.7 C) (Oral)  Resp 16  Ht 6\' 1"  (1.854 m)  Wt 185 lb 5 oz (84.057 kg)  BMI 24.45 kg/m2  SpO2 99% Physical Exam Appearance: in NAD, sitting up in bed, joined by son and daughter-in-law (former cardiology PA) HEENT: AT/[place], PERRL, EOMi Heart: irregularly irregular, no MRG, no pain to palpation or with arm movement Lungs: CTAB, no WRR Abdomen: BS+, soft, nontender Musculoskeletal: no tenderness Extremities: no edema b/l Neurologic: A&Ox3, strength and sensation intact throughout Skin: no rash or lesions   ED Course  Procedures (including critical care time) Labs Review Labs Reviewed  CBC  COMPREHENSIVE METABOLIC PANEL  TROPONIN I    Imaging Review No results found.    EKG Interpretation   Date/Time:  Monday October 20 2013 13:34:09 EDT Ventricular Rate:  68 PR Interval:  152 QRS Duration: 103 QT Interval:  389 QTC Calculation: 414 R Axis:   43 Text Interpretation:  Sinus rhythm Paired ventricular premature complexes  Probable left atrial enlargement Nonspecific repol abnormality, diffuse  leads No significant change was found Confirmed by CAMPOS  MD, Caryn BeeKEVIN  ([id number]) on 10/20/2013 2:18:52 PM      MDM   Final diagnoses:  None    Mr. Devon [name] is a 73 yo man with a history of ischemic heart disease who is here with a 2 week history of occasional chest pressure and shortness of breath but is not currently symptomatic. He is a patient of Dr. Patty SermonsBrackbill. His EKG showed normal rate and rhythm with likely left atrial enlargement; similar to his prior EKGs.   Concerning for unstable angina, so patient was started on a heparin drip. Also ordered CBC, CMP, troponin and CXR and consulted cardiology.      Dionne AnoJulia Neftaly Inzunza, MD 10/20/13 [phone]

## 2013-10-20 NOTE — ED Notes (Signed)
Attempted report 

## 2013-10-20 NOTE — Telephone Encounter (Signed)
New message     Daughter in law calling [place].    Patient has the phone beside him in case he need to call 911.   C/O went to gym today having chest pressure . Sounds like  Skipping beats . Took 4 asa. Somewhat feeling better. Would like to be seen today.

## 2013-10-20 NOTE — ED Notes (Signed)
ED Resident at bedside.

## 2013-10-20 NOTE — H&P (Signed)
Patient ID: Devon [name] MRN: [id number], DOB/AGE: 75/07/1940   Admit date: 10/20/2013   Primary Physician: Minda MeoARONSON,RICHARD A, MD Primary Cardiologist: Dr. Patty SermonsBrackbill  Pt. Profile:  73 year old Caucasian male with past medical history significant for remote single-vessel CABG in 1991 with LIMA to LAD and HTN presented with 2 episodes of CP at rest  Problem List  Past Medical History  Diagnosis Date  . Coronary artery disease     s/p CABG 1991 with LIMA to LAD, s/p DES to mid LCX 06/2011  . Hypertension   . Hyperlipidemia     statin intolerant; intolerant to zetia  . Bilateral plantar fasciitis   . H/O: gout     Past Surgical History  Procedure Laterality Date  . Coronary artery bypass graft  1991    severe 90% ostial LAD lesion with LIMA to LAD  . Cardiac catheterization  April 2013    DES to mid LCX; EF of 50%.      Allergies  Allergies  Allergen Reactions  . Metoprolol Other (See Comments)    Chest pressure  . Statins Other (See Comments)    Myalgias  . Zetia [Ezetimibe] Other (See Comments)    Myalgias    HPI  The patient is a 73 year old Caucasian male with past medical history significant for remote single-vessel CABG in 1991 with LIMA to LAD and HTN. His last echocardiogram in 2006 showed preserved ejection fraction of 50-55%. His last Myoview was in 2011 which showed EF 50%, no sign of ischemia. In 2013, he had decreasing exercise tolerance, exertional chest pain and new anterior T wave changes on his EKG and underwent repeat cardiac catheterization with subsequent DES stent to mid left circumflex artery. He was last seen by Dr. Patty SermonsBrackbill in the office in May 2015 at which time he was doing well. His Plavix was changed to 75 mg every other day because of easy bruising without significant exertional angina symptom. According to the patient, [facility] and would exercise 30-45 minutes a day without significant exertional symptom. He  denies any recent fever, chill, cough, lower extremity edema, orthopnea or paroxysmal nocturnal dyspnea. Daughter states the patient has been having more shortness breath with exertion, however the patient denies any significant increasing exertional dyspnea. He states he has been overworking for the past 2-3 weeks with cutting grass, cutting trees, moving stumps, and doing his daily activity. He had one episode of chest pain at rest yesterday which lasted an hour. Patient decided to do his own stress test this morning by going to the [facility] and exercised on the treadmill for 30-45 minutes without significant chest pain. However, as soon as he came home, the chest discomfort recurred. He denies any radiation. Family concerned that this may be related to previous anginal symptoms and decided [facility].  [facility], he was noted to have a blood pressure of 132/64. Heart rate 67. His chest discomfort was relieved with nitroglycerin and aspirin. Patient's symptom was concerning for unstable angina and ED started him on IV heparin. Initial troponin was negative. Creatinine 0.97. Chest x-ray showed no acute process. EKG showed sinus rhythm with heart rate 68, PVCs, ST downsloping in anterolateral leads which is new when compared to the previous EKG. Cardiology was consulted for chest pain  Home Medications  Prior to Admission medications   Medication Sig Start Date End Date Taking? Authorizing Provider  allopurinol (ZYLOPRIM)  100 MG tablet Take 200 mg by mouth every morning.    Yes Historical Provider, MD  aspirin EC 81 MG tablet Take 81 mg by mouth daily.   Yes Historical Provider, MD  benazepril (LOTENSIN) 10 MG tablet Take 10 mg by mouth daily.   Yes Historical Provider, MD  clopidogrel (PLAVIX) 75 MG tablet Take 75 mg by mouth every other day.   Yes Historical Provider, MD  desonide (DESOWEN) 0.05 % cream Apply 1 application topically daily as needed (for  spots).   Yes Historical Provider, MD  fish oil-omega-3 fatty acids 1000 MG capsule Take 1 g by mouth daily.   Yes Historical Provider, MD  metoprolol succinate (TOPROL-XL) 25 MG 24 hr tablet Take 12.5 mg by mouth daily.   Yes Historical Provider, MD  nitroGLYCERIN (NITROSTAT) 0.4 MG SL tablet Place 1 tablet (0.4 mg total) under the tongue every 5 (five) minutes x 3 doses as needed for chest pain (up to 3 doses). 07/17/12 10/20/13 Yes Cassell Clement, MD    Family History  Family History  Problem Relation Age of Onset  . Alzheimer's disease Mother     Social History  History   Social History  . Marital Status: Married    Spouse Name: N/A    Number of Children: N/A  . Years of Education: N/A   Occupational History  . Not on file.   Social History Main Topics  . Smoking status: Never Smoker   . Smokeless tobacco: Not on file  . Alcohol Use: Yes     Comment: Occassional Use  . Drug Use: No  . Sexual Activity: Not Currently   Other Topics Concern  . Not on file   Social History Narrative  . No narrative on file     Review of Systems General:  No chills, fever, night sweats or weight changes.  Cardiovascular:  No edema, orthopnea, palpitations, paroxysmal nocturnal dyspnea. +chest pain, dyspnea on exertion Dermatological: No rash, lesions/masses Respiratory: No cough, dyspnea Urologic: No hematuria, dysuria Abdominal:   No nausea, vomiting, diarrhea, bright red blood per rectum, melena, or hematemesis Neurologic:  No visual changes, wkns, changes in mental status. All other systems reviewed and are otherwise negative except as noted above.  Physical Exam  Blood pressure 169/85, pulse 66, temperature 98.1 F (36.7 C), temperature source Oral, resp. rate 15, height 6\' 1"  (1.854 m), weight 185 lb 5 oz (84.057 kg), SpO2 99.00%.  General: Pleasant, NAD Psych: Normal affect. Neuro: Alert and oriented X 3. Moves all extremities spontaneously. HEENT: Normal  Neck: Supple  without bruits or JVD. Lungs:  Resp regular and unlabored, CTA. Heart: RRR no s3, s4, or murmurs. Split S2, occasional skipped beat Abdomen: Soft, non-tender, non-distended, BS + x 4.  Extremities: No clubbing, cyanosis or edema. DP/PT/Radials 2+ and equal bilaterally.  Labs  Troponin [facility] Test) No results found for this basename: TROPIPOC,  in the last 72 hours  Recent Labs  10/20/13 1424  TROPONINI <0.30   Lab Results  Component Value Date   WBC 8.3 10/20/2013   HGB 16.7 10/20/2013   HCT 46.2 10/20/2013   MCV 88.8 10/20/2013   PLT 110* 10/20/2013     Recent Labs Lab 10/20/13 1423  NA 139  K 4.0  CL 102  CO2 24  BUN 13  CREATININE 0.97  CALCIUM 9.3  PROT 6.7  BILITOT 0.7  ALKPHOS 92  ALT 45  AST 43*  GLUCOSE 94   Lab Results  Component  Value Date   CHOL 188 07/17/2012   HDL 37.00* 07/17/2012   LDLCALC 120* 07/03/2011   TRIG 273.0* 07/17/2012   No results found for this basename: DDIMER     Radiology/Studies  Dg Chest 2 View  10/20/2013   CLINICAL DATA:  Chest pain, history of CABG and coronary artery stent placement  EXAM: CHEST  2 VIEW  COMPARISON:  07/02/2011  FINDINGS: Grossly unchanged cardiac silhouette and mediastinal contours post median sternotomy. Evaluation of the retrosternal clear space is obscured secondary to overlying soft tissues. No focal airspace opacities. There is mild elevation of the right hemidiaphragm. No pleural effusion or pneumothorax. No evidence of edema. No acute osseus abnormalities.  IMPRESSION: No acute cardiopulmonary disease.   Electronically Signed   By: Simonne Come M.D.   On: 10/20/2013 15:33    ECG  EKG showed sinus rhythm with heart rate 68, PVCs, ST downsloping in anterolateral leads which is new when compared to the previous EKG  Echocardiogram  2006 preserved ejection fraction of 50-55%    ASSESSMENT AND PLAN  1. CP concerning for unstable angina  - no clear exertional CP, however significant risk factor  and EKG change  - plan for cardiac cath tomorrow for definitive assessment  - continue IV heparin.  - his plavix was changed to 75mg  every other day due to bruising, will continue that for now, and if need PCI, may need to change it to daily dosing  2. CAD  - s/p single v CABG LIMA to LAD in 1991  - DES to mid LCx in 2013  3. HTN 4. Intolerance to statin and ezitimibe   Ramond Dial, PA-C 10/20/2013, 4:30 PM  Personally seen and examined. Agree with above. Symptoms mostly at rest but concerning.  ECG with lateral depression and frequent PVC He states that he has had stress tests in the past that were all normal despite 90% LAD lesion.  Cath for definitive diagnosis with [place].   Donato Schultz, MD

## 2013-10-20 NOTE — Consult Note (Signed)
ANTICOAGULATION CONSULT NOTE - Initial Consult  Pharmacy Consult for Heparin Indication: chest pain/ACS  Allergies  Allergen Reactions  . Metoprolol Other (See Comments)    Chest pressure  . Statins Other (See Comments)    Myalgias  . Zetia [Ezetimibe] Other (See Comments)    Myalgias    Patient Measurements: Height: 6\' 1"  (185.4 cm) Weight: 185 lb 5 oz (84.057 kg) IBW/kg (Calculated) : 79.9 Heparin Dosing Weight: 84kg  Vital Signs: Temp: 98.1 F (36.7 C) (08/17 1334) Temp src: Oral (08/17 1334) BP: 145/74 mmHg (08/17 1430) Pulse Rate: 57 (08/17 1430)  Labs: No results found for this basename: HGB, HCT, PLT, APTT, LABPROT, INR, HEPARINUNFRC, CREATININE, CKTOTAL, CKMB, TROPONINI,  in the last 72 hours  Estimated Creatinine Clearance: 65.6 ml/min (by C-G formula based on Cr of 1.1).   Medical History: Past Medical History  Diagnosis Date  . Coronary artery disease     s/p CABG 1991 with LIMA to LAD, s/p DES to mid LCX 06/2011  . Hypertension   . Hyperlipidemia     statin intolerant; intolerant to zetia  . Bilateral plantar fasciitis   . H/O: gout     Medications:  No anticoagulants pta  Assessment: 73yof with history of CABG (1991) and DES to mid left circumflex presents to the ED with chest pain. She will begin IV heparin. Cardiology consult pending.   Goal of Therapy:  Heparin level 0.3-0.7 units/ml Monitor platelets by anticoagulation protocol: Yes   Plan:  1) Heparin bolus 4000 units x 1 2) Heparin drip at 1200 units/hr 3) Check 8 hour heparin level 4) Daily heparin level and CBC  Devon RiggerMarkle, Devon [name] 10/20/2013,3:17 PM

## 2013-10-20 NOTE — ED Notes (Signed)
MD remains at bedside

## 2013-10-20 NOTE — ED Provider Notes (Signed)
I saw and evaluated the patient, reviewed the resident's note and I agree with the findings and plan.   EKG Interpretation   Date/Time:  Monday October 20 2013 13:34:09 EDT Ventricular Rate:  68 PR Interval:  152 QRS Duration: 103 QT Interval:  389 QTC Calculation: 414 R Axis:   43 Text Interpretation:  Sinus rhythm Paired ventricular premature complexes  Probable left atrial enlargement Nonspecific repol abnormality, diffuse  leads No significant change was found Confirmed by Daryel Kenneth  MD, Solyana Nonaka  ([id number]) on 10/20/2013 2:18:52 PM      Concerning for unstable angina. Will ask cardiology to evaluate at this time.   Lyanne CoKevin M Christophere Hillhouse, MD 10/20/13 [phone]

## 2013-10-20 NOTE — ED Notes (Signed)
Pt., was carrying groceries into his home and developed Chest pressure center of the chest, non-radiating.   Pt. Denies any n/v/ of sob.  Skin is p/w/d, Pt. Took 1 nitro at home and 1 en-route pt. Is pain free.  Pt. Also has taken 324mg  of Aspirin.  Pt. Has a NSR with PVC.    Pt. Had a cardiac stent placed a few years ago and Bypap in 1991.

## 2013-10-20 NOTE — ED Notes (Signed)
Cardiology at bedside.

## 2013-10-20 NOTE — ED Notes (Signed)
Pt taking Plavix every other day,Verified with Cariology to give Heparin - states ok to give at this time

## 2013-10-20 NOTE — Telephone Encounter (Signed)
Pt and Daughter calling to inform Dr Patty SermonsBrackbill and nurse that the pt went [facility] the [facility] this [facility] and got on the treadmill to walk, as he does everyday, and started experiencing chest pressure/pain with mild sob.  Pt reports he feels like "an elephant is sitting on his chest." Pt states he is experiencing some sob as well.  Pt reports his current BP is 146/70.  Pt took 4 baby ASA with mild relief.  Pt is prescribed Nitroglycerin but has not yet took this.  Pt describes the sensations in his chest as "skipping beats." Pt states the pain is midsternal in location with no radiation.  Pt denies any LEE at this time.  Pt states "something just isn't right." Pt rates the pain 3 on scale of 0-10.  Pt states pain is intermittent.  Pt requesting an urgent OV with Dr Patty SermonsBrackbill today.  Advised pt that per Dr Patty SermonsBrackbill, he needs to take his nitroglycerin (being his BP is stable), and call 911 to be transported [facility].  Informed the pt that I will notify our card master Trish that he will be arriving via EMS from private home for further cardiac work-up.  Instructed pt to make EMS aware that he has already taken 4 baby ASA 81 mg.  Pt verbalized understanding and agrees with this plan.  Daughter and son standing beside pt and will call 911.  Trish, card master notified at 12:40pm.

## 2013-10-21 ENCOUNTER — Encounter (HOSPITAL_COMMUNITY): Payer: Self-pay | Admitting: Physician Assistant

## 2013-10-21 ENCOUNTER — Encounter (HOSPITAL_COMMUNITY)
Admission: EM | Disposition: A | Discharge status: Home / Self Care | Payer: Self-pay | Source: Home / Self Care | Attending: Emergency Medicine

## 2013-10-21 DIAGNOSIS — I2510 Atherosclerotic heart disease of native coronary artery without angina pectoris: Secondary | ICD-10-CM | POA: Diagnosis not present

## 2013-10-21 DIAGNOSIS — I10 Essential (primary) hypertension: Secondary | ICD-10-CM | POA: Diagnosis not present

## 2013-10-21 DIAGNOSIS — R079 Chest pain, unspecified: Secondary | ICD-10-CM | POA: Diagnosis not present

## 2013-10-21 DIAGNOSIS — R0602 Shortness of breath: Secondary | ICD-10-CM | POA: Diagnosis not present

## 2013-10-21 DIAGNOSIS — D696 Thrombocytopenia, unspecified: Secondary | ICD-10-CM

## 2013-10-21 HISTORY — PX: LEFT HEART CATHETERIZATION WITH CORONARY ANGIOGRAM: SHX5451

## 2013-10-21 LAB — CBC
HCT: 48 % (ref 39.0–52.0)
Hemoglobin: 17 g/dL (ref 13.0–17.0)
MCH: 31.7 pg (ref 26.0–34.0)
MCHC: 35.4 g/dL (ref 30.0–36.0)
MCV: 89.6 fL (ref 78.0–100.0)
Platelets: 98 10*3/uL — ABNORMAL LOW (ref 150–400)
RBC: 5.36 MIL/uL (ref 4.22–5.81)
RDW: 13.6 % (ref 11.5–15.5)
WBC: 6.5 10*3/uL (ref 4.0–10.5)

## 2013-10-21 LAB — BASIC METABOLIC PANEL
Anion gap: 14 (ref 5–15)
BUN: 14 mg/dL (ref 6–23)
CHLORIDE: 106 meq/L (ref 96–112)
CO2: 21 meq/L (ref 19–32)
Calcium: 9.4 mg/dL (ref 8.4–10.5)
Creatinine, Ser: 0.93 mg/dL (ref 0.50–1.35)
GFR calc Af Amer: >90 mL/min (ref >90)
GFR calc non Af Amer: 80 mL/min — ABNORMAL LOW (ref >90)
Glucose, Bld: 83 mg/dL (ref 70–99)
Potassium: 4.1 mEq/L (ref 3.7–5.3)
Sodium: 141 mEq/L (ref 137–147)

## 2013-10-21 LAB — LIPID PANEL
CHOLESTEROL: 186 mg/dL (ref 0–200)
HDL: 41 mg/dL (ref >39)
LDL CALC: 115 mg/dL — AB (ref 0–99)
Total CHOL/HDL Ratio: 4.5 RATIO
Triglycerides: 151 mg/dL — ABNORMAL HIGH (ref <150)
VLDL: 30 mg/dL (ref 0–40)

## 2013-10-21 LAB — TROPONIN I
Troponin I: <0.3 ng/mL (ref <0.30)
Troponin I: <0.3 ng/mL (ref <0.30)

## 2013-10-21 LAB — PROTIME-INR
INR: 1.01 (ref 0.00–1.49)
Prothrombin Time: 13.3 seconds (ref 11.6–15.2)

## 2013-10-21 LAB — HEPARIN LEVEL (UNFRACTIONATED)
HEPARIN UNFRACTIONATED: 0.47 [IU]/mL (ref 0.30–0.70)
Heparin Unfractionated: 0.61 IU/mL (ref 0.30–0.70)

## 2013-10-21 SURGERY — LEFT HEART CATHETERIZATION WITH CORONARY ANGIOGRAM
Anesthesia: LOCAL

## 2013-10-21 MED ORDER — NITROGLYCERIN 1 MG/10 ML FOR IR/CATH LAB
INTRA_ARTERIAL | Status: AC
  Filled 2013-10-21: qty 10

## 2013-10-21 MED ORDER — MIDAZOLAM HCL 2 MG/2ML IJ SOLN
INTRAMUSCULAR | Status: AC
  Filled 2013-10-21: qty 2

## 2013-10-21 MED ORDER — HEPARIN (PORCINE) IN NACL 2-0.9 UNIT/ML-% IJ SOLN
INTRAMUSCULAR | Status: AC
  Filled 2013-10-21: qty 1000

## 2013-10-21 MED ORDER — VERAPAMIL HCL 2.5 MG/ML IV SOLN
INTRAVENOUS | Status: AC
  Filled 2013-10-21: qty 2

## 2013-10-21 MED ORDER — SODIUM CHLORIDE 0.9 % IV SOLN
1.0000 mL/kg/h | INTRAVENOUS | Status: AC
Start: 2013-10-21 — End: 2013-10-21

## 2013-10-21 MED ORDER — HEPARIN SODIUM (PORCINE) 1000 UNIT/ML IJ SOLN
INTRAMUSCULAR | Status: AC
  Filled 2013-10-21: qty 1

## 2013-10-21 MED ORDER — NITROGLYCERIN 0.4 MG SL SUBL: 0.4000 mg | SUBLINGUAL_TABLET | SUBLINGUAL | Status: DC | PRN

## 2013-10-21 MED ORDER — LIDOCAINE HCL (PF) 1 % IJ SOLN
INTRAMUSCULAR | Status: AC
  Filled 2013-10-21: qty 30

## 2013-10-21 MED ORDER — FENTANYL CITRATE 0.05 MG/ML IJ SOLN
INTRAMUSCULAR | Status: AC
  Filled 2013-10-21: qty 2

## 2013-10-21 NOTE — Interval H&P Note (Signed)
History and Physical Interval Note:  10/21/2013 8:23 AM  Devon [name]  has presented today for surgery, with the diagnosis of cp  The various methods of treatment have been discussed with the patient and family. After consideration of risks, benefits and other options for treatment, the patient has consented to  Procedure(s): LEFT HEART CATHETERIZATION WITH CORONARY ANGIOGRAM (N/A) as a surgical intervention .  The patient's history has been reviewed, patient examined, no change in status, stable for surgery.  I have reviewed the patient's chart and labs.  Questions were answered to the patient's satisfaction.    Cath Lab Visit (complete for each Cath Lab visit)  Clinical Evaluation Leading to the Procedure:   ACS: Yes.    Non-ACS:    Anginal Classification: CCS IV  Anti-ischemic medical therapy: Minimal Therapy (1 class of medications)  Non-Invasive Test Results: No non-invasive testing performed  Prior CABG: Previous CABG       Theron Aristaeter [facility]MD,FACC 10/21/2013 8:23 AM

## 2013-10-21 NOTE — CV Procedure (Signed)
    Cardiac Catheterization Procedure Note  Name: Devon [name] MRN: [id number] DOB: 06/08/1940  Procedure: Left Heart Cath, Selective Coronary Angiography, LIMA angiography, LV angiography  Indication: 73 yo WM with history of CAD s/p single vessel CABG and prior stenting of the LCx presents with symptoms of chest pain at rest.   Procedural Details: The left wrist was prepped, draped, and anesthetized with 1% lidocaine. Using the modified Seldinger technique, a 6 French slender sheath was introduced into the left radial artery. 3 mg of verapamil was administered through the sheath, weight-based unfractionated heparin was administered intravenously. Standard Judkins catheters were used for selective coronary angiography and left ventriculography. A left Judkins 3.0 catheter was used to access the left coronary artery. Catheter exchanges were performed over an exchange length guidewire. There were no immediate procedural complications. A TR band was used for radial hemostasis at the completion of the procedure.  The patient was transferred to the post catheterization recovery area for further monitoring.  Procedural Findings: Hemodynamics: AO 136/59 mean 86 mm Hg LV 137/14 mm Hg  Coronary angiography: Coronary dominance: right  Left mainstem: Normal.   Left anterior descending (LAD): There is severe disease in the mid LAD with competitive flow distal to this from the LIMA graft. The first and second diagonal branches are normal.   Left circumflex (LCx): Normal. The stent in the mid vessel is widely patent.  Right coronary artery (RCA): Mild 20% disease in the mid vessel.   LIMA to the LAD is widely patent.   Left ventriculography: Left ventricular systolic function is normal, LVEF is estimated at 55-65%, there is no significant mitral regurgitation   Final Conclusions:   1. Single vessel obstructive CAD 2. Patent LIMA to the LAD. 3. Normal LV function.  Recommendations: Continue  prior medical therapy. Patent is stable for DC today.  Devon [name], MDFACC  10/21/2013, 9:08 AM

## 2013-10-21 NOTE — Progress Notes (Signed)
Discharge order written. Instructions given and reviewed with pt. Post cath site care also reviewed with pt. All questions answered. Pt discharged in the care of his son.

## 2013-10-21 NOTE — Progress Notes (Signed)
TR band deflated and removed per protocol. Site a Level 0. Gauze and tegaderm in place. No immediate complications. VSS. Will continue to monitor.

## 2013-10-21 NOTE — Consult Note (Signed)
ANTICOAGULATION CONSULT NOTE - Initial Consult  Pharmacy Consult for Heparin Indication: chest pain/ACS  Allergies  Allergen Reactions  . Metoprolol Other (See Comments)    Chest pressure  . Statins Other (See Comments)    Myalgias  . Zetia [Ezetimibe] Other (See Comments)    Myalgias    Patient Measurements: Height: 6\' 2"  (188 cm) Weight: 188 lb 8 oz (85.503 kg) IBW/kg (Calculated) : 82.2 Heparin Dosing Weight: 84kg  Vital Signs: Temp: 98.1 F (36.7 C) (08/17 2001) Temp src: Oral (08/17 2001) BP: 155/84 mmHg (08/17 2001) Pulse Rate: 63 (08/17 2001)  Labs:  Recent Labs  10/20/13 1423 10/20/13 1424 10/20/13 2040 10/20/13 2344  HGB 16.7  --   --   --   HCT 46.2  --   --   --   PLT 110*  --   --   --   HEPARINUNFRC  --   --   --  0.47  CREATININE 0.97  --   --   --   TROPONINI  --  <0.30 <0.30  --     Estimated Creatinine Clearance: 76.5 ml/min (by C-G formula based on Cr of 0.97).   Medical History: Past Medical History  Diagnosis Date  . Coronary artery disease     s/p CABG 1991 with LIMA to LAD, s/p DES to mid LCX 06/2011  . Hypertension   . Hyperlipidemia     statin intolerant; intolerant to zetia  . Bilateral plantar fasciitis   . H/O: gout     Medications:  No anticoagulants pta  Assessment: 73yof with history of CABG (1991) and DES to mid left circumflex presentd to the ED with chest pain.HL #1 is 0.47 IU no bleeding noted.  Goal of Therapy:  Heparin level 0.3-0.7 units/ml Monitor platelets by anticoagulation protocol: Yes   Plan:  Continue heparin at 1200 units/hr and f/u daily labs.  Janice CoffinEarl, Ha Placeres Jonathan 10/21/2013,12:11 AM

## 2013-10-21 NOTE — Progress Notes (Addendum)
Reviewed Dr. Elvis CoilJordan's catheterization note. Reassurance. Okay with discharge later today. No medication changes.  Followup in office, Dr. Patty SermonsBrackbill in 2-4 weeks.  Exam is benign, regular rate and rhythm, lungs are clear. TR band in place. Deflating.  Donato SchultzSKAINS, Larisa Lanius, MD

## 2013-10-21 NOTE — Discharge Summary (Signed)
Discharge Summary   Patient ID: Devon [name] MRN: [id number], DOB/AGE: 75/07/1940 75 y.o. Admit date: 10/20/2013 D/C date:     10/21/2013  Primary Care Provider: Minda MeoARONSON,RICHARD A, MD Primary Cardiologist: Patty SermonsBrackbill  Primary Discharge Diagnoses:  1. Chest pain, possibly musculoskeletal  - stable cath 10/21/13 2. CAD s/p single v CABG LIMA to LAD in 1991, DES to mid LCx in 2013 3. HTN 4. Hyperlipidemia - intolerance to stains and Zetia 5. ?Chronic appearing thrombocytopenia  Secondary Discharge Diagnoses:  1. H/o Gout 2. H/o Bilateral plantar fasciitis  Hospital Course:  Devon [name] is a 73 year old Caucasian male with PMH significant for CAD (s/p CABGx1 in 1991 with LIMA-LAD and DES to Cx in 2013), HTN, and statin-intolerant HLD. He was last seen by Dr. Patty SermonsBrackbill in the office in May 2015 at which time he was doing well. His Plavix was changed to 75 mg every other day because of easy bruising without significant exertional angina symptom. According to the patient, [facility] and would exercise 30-45 minutes a day without significant exertional symptoms. He denied any recent fever, chills, cough, lower extremity edema, orthopnea or paroxysmal nocturnal dyspnea. He had one episode of chest pain at rest on 10/19/13. He decided to do his own stress test this morning by going to [facility] and exercised on the treadmill for 30-45 minutes without significant chest pain. However, as soon as he came home, the chest discomfort recurred. He denied any radiation. His family became concerned so the patient sought medical care in the ED. On arrival, vitals were stable. He was not hypertensive, tachycardic or hypoxic. His chest discomfort was relieved with nitroglycerin and aspirin. Initial troponin was negative. CXR showed no acute process. EKG showed sinus rhythm with heart rate 68, PVCs, ST downsloping in anterolateral leads which was new when compared to the previous EKG. He was  started on IV heparin by the ED for concern for [place]. He ruled out for MI and underwent cardiac cath today showing Left mainstem: Normal.  Left anterior descending (LAD): There is severe disease in the mid LAD with competitive flow distal to this from the LIMA graft. The first and second diagonal branches are normal.  Left circumflex (LCx): Normal. The stent in the mid vessel is widely patent.  Right coronary artery (RCA): Mild 20% disease in the mid vessel.  LIMA to the LAD is widely patent.  Left ventriculography: Left ventricular systolic function is normal, LVEF is estimated at 55-65%, there is no significant mitral regurgitation  Continued prior medical therapy was recommended. The patient was provided reassurance as Dr. Anne FuSkains did not think his pain was cardiac. Dr. Anne FuSkains has seen and examined the patient today and feels he is stable for discharge. The patient did request refill of NTG tablets to have on hand since his are nearing expiration, and this was sent in.  Of note, the patient's platelet count was somewhat decreased at 98. This appears chronic back to at least 2013 which is the furthest labs we have on him. He was advised to f/u PCP to evaluate this if it hasn't already been checked out.   Discharge Vitals: Blood pressure 140/75, pulse 64, temperature 98.2 F (36.8 C), temperature source Oral, resp. rate 18, height 6\' 2"  (1.88 m), weight 188 lb 8 oz (85.503 kg), SpO2 100.00%.  Labs: Lab Results  Component Value Date   WBC 6.5 10/21/2013   HGB 17.0 10/21/2013   HCT 48.0 10/21/2013  MCV 89.6 10/21/2013   PLT 98* 10/21/2013    Recent Labs Lab 10/20/13 1423 10/21/13 0410  NA 139 141  K 4.0 4.1  CL 102 106  CO2 24 21  BUN 13 14  CREATININE 0.97 0.93  CALCIUM 9.3 9.4  PROT 6.7  --   BILITOT 0.7  --   ALKPHOS 92  --   ALT 45  --   AST 43*  --   GLUCOSE 94 83    Recent Labs  10/20/13 1424 10/20/13 2040 10/21/13 0410 10/21/13 0730  TROPONINI <0.30 <0.30 <0.30 <0.30     Lab Results  Component Value Date   CHOL 186 10/21/2013   HDL 41 10/21/2013   LDLCALC 115* 10/21/2013   TRIG 151* 10/21/2013    Diagnostic Studies/Procedures   Dg Chest 2 View 10/20/2013   CLINICAL DATA:  Chest pain, history of CABG and coronary artery stent placement  EXAM: CHEST  2 VIEW  COMPARISON:  07/02/2011  FINDINGS: Grossly unchanged cardiac silhouette and mediastinal contours post median sternotomy. Evaluation of the retrosternal clear space is obscured secondary to overlying soft tissues. No focal airspace opacities. There is mild elevation of the right hemidiaphragm. No pleural effusion or pneumothorax. No evidence of edema. No acute osseus abnormalities.  IMPRESSION: No acute cardiopulmonary disease.   Electronically Signed   By: Simonne Come M.D.   On: 10/20/2013 15:33   Cardiac Cath 10/21/13 Cardiac Catheterization Procedure Note  Name: Devon [name]  MRN: [id number]  DOB: 12-18-1940  Procedure: Left Heart Cath, Selective Coronary Angiography, LIMA angiography, LV angiography  Indication: 73 yo WM with history of CAD s/p single vessel CABG and prior stenting of the LCx presents with symptoms of chest pain at rest.  Procedural Details: The left wrist was prepped, draped, and anesthetized with 1% lidocaine. Using the modified Seldinger technique, a 6 French slender sheath was introduced into the left radial artery. 3 mg of verapamil was administered through the sheath, weight-based unfractionated heparin was administered intravenously. Standard Judkins catheters were used for selective coronary angiography and left ventriculography. A left Judkins 3.0 catheter was used to access the left coronary artery. Catheter exchanges were performed over an exchange length guidewire. There were no immediate procedural complications. A TR band was used for radial hemostasis at the completion of the procedure. The patient was transferred to the post catheterization recovery area for further  monitoring.  Procedural Findings:  Hemodynamics:  AO 136/59 mean 86 mm Hg  LV 137/14 mm Hg  Coronary angiography:  Coronary dominance: right  Left mainstem: Normal.  Left anterior descending (LAD): There is severe disease in the mid LAD with competitive flow distal to this from the LIMA graft. The first and second diagonal branches are normal.  Left circumflex (LCx): Normal. The stent in the mid vessel is widely patent.  Right coronary artery (RCA): Mild 20% disease in the mid vessel.  LIMA to the LAD is widely patent.  Left ventriculography: Left ventricular systolic function is normal, LVEF is estimated at 55-65%, there is no significant mitral regurgitation  Final Conclusions:  1. Single vessel obstructive CAD  2. Patent LIMA to the LAD.  3. Normal LV function.  Recommendations: Continue prior medical therapy. Patent is stable for DC today.  Peter [place], MDFACC  10/21/2013, 9:08 AM    Discharge Medications   Current Discharge Medication List    CONTINUE these medications which have CHANGED   Details  nitroGLYCERIN (NITROSTAT) 0.4 MG SL tablet Place 1 tablet (0.4 mg  total) under the tongue every 5 (five) minutes as needed for chest pain (up to 3 doses). Qty: 25 tablet, Refills: 3      CONTINUE these medications which have NOT CHANGED   Details  allopurinol (ZYLOPRIM) 100 MG tablet Take 200 mg by mouth every morning.     aspirin EC 81 MG tablet Take 81 mg by mouth daily.    benazepril (LOTENSIN) 10 MG tablet Take 10 mg by mouth daily.    clopidogrel (PLAVIX) 75 MG tablet Take 75 mg by mouth every other day.    desonide (DESOWEN) 0.05 % cream Apply 1 application topically daily as needed (for spots).    fish oil-omega-3 fatty acids 1000 MG capsule Take 1 g by mouth daily.    metoprolol succinate (TOPROL-XL) 25 MG 24 hr tablet Take 12.5 mg by mouth daily.        Disposition   The patient will be discharged in stable condition to home. Discharge Instructions    Diet - low sodium heart healthy    Complete by:  As directed      Increase activity slowly    Complete by:  As directed   No driving for 2 days. No lifting over 5 lbs for 1 week. No sexual activity for 1 week. Keep procedure site clean & dry. If you notice increased pain, swelling, bleeding or pus, call/return!  You may shower, but no soaking baths/hot tubs/pools for 1 week.          Follow-up Information   Follow up with Ronie Spies, PA-C. [facility] - 11/13/13 at 9:45am)    Specialty:  Cardiology   Contact information:   [address] [place] [address] [phone]       Follow up with ARONSON,RICHARD A, MD. (Your platelet count appears mildly decreased (98-110). It looks like this is stable for you since at least 2013. Please follow up with your primary doctor to evaluate this if it has not previously been checked out.)    Specialty:  Internal Medicine   Contact information:   [address] [facility] [place] [address] [phone]         Duration of Discharge Encounter: Greater than 30 minutes including physician and PA time.  Signed, Ronie Spies PA-C 10/21/2013, 12:21 PM  Personally seen and examined. Agree with above. Reassuring heart catheterization. Donato Schultz, MD

## 2013-10-21 NOTE — Progress Notes (Signed)
UR Completed George Haggart Graves-Bigelow, RN,BSN [phone]  

## 2013-11-04 ENCOUNTER — Encounter: Payer: Self-pay | Admitting: Cardiology

## 2013-11-04 ENCOUNTER — Ambulatory Visit (INDEPENDENT_AMBULATORY_CARE_PROVIDER_SITE_OTHER): Payer: Medicare Other | Admitting: Cardiology

## 2013-11-04 VITALS — BP 130/80 | HR 54 | Ht 74.0 in | Wt 188.0 lb

## 2013-11-04 DIAGNOSIS — I2510 Atherosclerotic heart disease of native coronary artery without angina pectoris: Secondary | ICD-10-CM

## 2013-11-04 DIAGNOSIS — D696 Thrombocytopenia, unspecified: Secondary | ICD-10-CM

## 2013-11-04 DIAGNOSIS — E785 Hyperlipidemia, unspecified: Secondary | ICD-10-CM

## 2013-11-04 NOTE — Assessment & Plan Note (Signed)
Thought to be related to allopurinol, followed by Dr. Jacky Kindle.

## 2013-11-04 NOTE — Patient Instructions (Signed)
Your physician recommends that you schedule a follow-up appointment in: 3 months with DR. Brackbill  Your physician recommends that you continue on your current medications as directed. Please refer to the Current Medication list given to you today.

## 2013-11-04 NOTE — Assessment & Plan Note (Signed)
s/p CABG (1991 by Dr. Edwyna Shell, LIMA to LAD) - Cardiac cath 07/03/11 revealed EF 50%, patent LIMA to LAD, and high grade stenosis of mid LCx w/ successful DES placement Cath 10/20/13  With single vessel CAD with patent LIMA to LAD, patent LCx stent

## 2013-11-04 NOTE — Progress Notes (Signed)
11/04/2013   PCP: Minda Meo, MD   Chief Complaint  Patient presents with  . Follow-up    post hospital    Primary Cardiologist: Dr. Patty Sermons  HPI:  73 year old Caucasian male with PMH significant for CAD (s/p CABGx1 in 1991 with LIMA-LAD and DES to Cx in 2013), HTN, and statin-intolerant HLD is here for follow up after hospitalization.  He had chest pain and was admitted to [facility] 10/20/13, negative MI and cardiac cath: Final Conclusions:  1. Single vessel obstructive CAD  2. Patent LIMA to the LAD, patent LCx stent  3. Normal LV function at 65%  Pt believes he just did too much in one day.  He does not like to sit still and now that his wife has died there is no one to tell him to slow down.   No further chest pain.  No SOB.  He has hx of PVCs and they were increased on admit.  He just exercised at the [facility] this  AM.  He also has thrombocytopenia that he tracts back to beginning allopurinol.  Dr. Jacky Kindle is following.    Allergies  Allergen Reactions  . Metoprolol Other (See Comments)    Chest pressure  . Statins Other (See Comments)    Myalgias  . Zetia [Ezetimibe] Other (See Comments)    Myalgias    Current Outpatient Prescriptions  Medication Sig Dispense Refill  . allopurinol (ZYLOPRIM) 100 MG tablet Take 200 mg by mouth every morning.       Marland Kitchen aspirin EC 81 MG tablet Take 81 mg by mouth daily.      . benazepril (LOTENSIN) 10 MG tablet Take 10 mg by mouth daily.      . clopidogrel (PLAVIX) 75 MG tablet Take 75 mg by mouth every other day.      Marland Kitchen desonide (DESOWEN) 0.05 % cream Apply 1 application topically daily as needed (for spots).      . fish oil-omega-3 fatty acids 1000 MG capsule Take 1 g by mouth daily.      . metoprolol succinate (TOPROL-XL) 25 MG 24 hr tablet Take 12.5 mg by mouth daily.      . nitroGLYCERIN (NITROSTAT) 0.4 MG SL tablet Place 1 tablet (0.4 mg total) under the tongue every 5 (five) minutes as needed for chest pain (up to 3  doses).  25 tablet  3   No current facility-administered medications for this visit.    Past Medical History  Diagnosis Date  . Coronary artery disease     a. s/p CABG 1991 with LIMA to LAD. b. s/p DES to mid LCX 06/2011. c. Cath 10/2013: severe disease in LAD with competitive flow distal to this from LIMA graft. EF 55-60%. Continued med rx.  . Hypertension   . Hyperlipidemia     statin intolerant; intolerant to zetia  . Bilateral plantar fasciitis   . H/O: gout   . Thrombocytopenia     Past Surgical History  Procedure Laterality Date  . Coronary artery bypass graft  1991    severe 90% ostial LAD lesion with LIMA to LAD  . Cardiac catheterization  April 2013    DES to mid LCX; EF of 50%.     OZH:YQMVHQI:ON colds or fevers, no weight changes Skin:no rashes or ulcers HEENT:no blurred vision, no congestion CV:see HPI PUL:see HPI GI:no diarrhea constipation or melena, no indigestion GU:no hematuria, no dysuria MS:no joint pain, no claudication Neuro:no syncope, no lightheadedness Endo:no  diabetes, no thyroid disease  Wt Readings from Last 3 Encounters:  11/04/13 188 lb (85.276 kg)  10/20/13 188 lb 8 oz (85.503 kg)  10/20/13 188 lb 8 oz (85.503 kg)   Lipid Panel     Component Value Date/Time   CHOL 186 10/21/2013 0410   TRIG 151* 10/21/2013 0410   HDL 41 10/21/2013 0410   CHOLHDL 4.5 10/21/2013 0410   VLDL 30 10/21/2013 0410   LDLCALC 115* 10/21/2013 0410  (allergy to statins)  PHYSICAL EXAM BP 130/80  Pulse 54  Ht  (1.88 m)  Wt 188 lb (85.276 kg)  BMI 24.13 kg/m2 General:Pleasant affect, NAD Skin:Warm and dry, brisk capillary refill HEENT:normocephalic, sclera clear, mucus membranes moist Neck:supple, no JVD, no bruits  Heart:S1S2 RRR without murmur, gallup, rub or click Lungs:clear without rales, rhonchi, or wheezes ZOX:WRUE, non tender, + BS, do not palpate liver spleen or masses Ext:no lower ext edema, 2+ pedal pulses, 2+ radial pulses, lt radial cath site  without hematoma. Neuro:alert and oriented, MAE, follows commands, + facial symmetry   ASSESSMENT AND PLAN CAD (coronary artery disease)  s/p CABG (1991 by Dr. Edwyna Shell, LIMA to LAD) - Cardiac cath 07/03/11 revealed EF 50%, patent LIMA to LAD, and high grade stenosis of mid LCx w/ successful DES placement Cath 10/20/13  With single vessel CAD with patent LIMA to LAD, patent LCx stent  Thrombocytopenia Thought to be related to allopurinol, followed by Dr. Jacky Kindle.   HLD (hyperlipidemia) lsl elevated but pt allergic to statins.

## 2013-11-04 NOTE — Assessment & Plan Note (Signed)
lsl elevated but pt allergic to statins.

## 2013-11-13 ENCOUNTER — Encounter: Payer: Medicare Other | Admitting: Physician Assistant

## 2014-01-11 ENCOUNTER — Other Ambulatory Visit: Payer: Self-pay | Admitting: Cardiology

## 2014-02-04 ENCOUNTER — Ambulatory Visit (INDEPENDENT_AMBULATORY_CARE_PROVIDER_SITE_OTHER): Payer: Medicare Other | Admitting: Cardiology

## 2014-02-04 ENCOUNTER — Encounter: Payer: Self-pay | Admitting: Cardiology

## 2014-02-04 VITALS — BP 148/92 | HR 70 | Ht 73.0 in | Wt 188.0 lb

## 2014-02-04 DIAGNOSIS — I2510 Atherosclerotic heart disease of native coronary artery without angina pectoris: Secondary | ICD-10-CM

## 2014-02-04 DIAGNOSIS — I119 Hypertensive heart disease without heart failure: Secondary | ICD-10-CM

## 2014-02-04 DIAGNOSIS — I10 Essential (primary) hypertension: Secondary | ICD-10-CM

## 2014-02-04 DIAGNOSIS — E785 Hyperlipidemia, unspecified: Secondary | ICD-10-CM

## 2014-02-04 MED ORDER — BENAZEPRIL HCL 20 MG PO TABS: 20.0000 mg | ORAL_TABLET | Freq: Every day | ORAL | Status: DC

## 2014-02-04 NOTE — Assessment & Plan Note (Signed)
The patient's blood pressure is running high.  His heart rate is not accelerated.  We will increase his benazepril to 20 mg daily.  He has normal renal function.

## 2014-02-04 NOTE — Progress Notes (Signed)
Devon [name] Date of Birth:  07/01/1940 [facility] [address] [place], [place]  [id number] [phone]        Fax   [phone]   History of Present Illness: This pleasant 73 year old is seen for a scheduled followup office visit. He has a history of known ischemic heart disease. He had remote CABG in 1991 with a LIMA to the LAD. In 2013 he developed a decrease in exercise tolerance and new anterior T wave changes on his EKG and underwent cardiac catheterization with subsequent DES to the mid circumflex. His ejection fraction was 50%. He has a history of dyslipidemia but is intolerant to statins and also to ezetimibe.  In August 2015 he had chest discomfort and was admitted and had cardiac catheterization which showed a widely patent stent to the circumflex and a widely patent LIMA to the LAD and no new areas of stenosis.  His left ventricular ejection fraction was 55-65%.  He was continued on same medication. Since his admission in August 2015 he has done well with no further chest discomfort.  [facility] and works out.   Current Outpatient Prescriptions  Medication Sig Dispense Refill  . allopurinol (ZYLOPRIM) 100 MG tablet Take 200 mg by mouth every morning.     Marland Kitchen. aspirin EC 81 MG tablet Take 81 mg by mouth daily.    . benazepril (LOTENSIN) 10 MG tablet Take 10 mg by mouth daily.    . clopidogrel (PLAVIX) 75 MG tablet Take 75 mg by mouth every other day.    Marland Kitchen. desonide (DESOWEN) 0.05 % cream Apply 1 application topically daily as needed (for spots).    . fish oil-omega-3 fatty acids 1000 MG capsule Take 1 g by mouth daily.    . metoprolol succinate (TOPROL-XL) 25 MG 24 hr tablet Take 12.5 mg by mouth daily.    . nitroGLYCERIN (NITROSTAT) 0.4 MG SL tablet Place 1 tablet (0.4 mg total) under the tongue every 5 (five) minutes as needed for chest pain (up to 3 doses). 25 tablet 3   No current facility-administered medications  for this visit.    Allergies  Allergen Reactions  . Metoprolol Other (See Comments)    Chest pressure  . Statins Other (See Comments)    Myalgias  . Zetia [Ezetimibe] Other (See Comments)    Myalgias    Patient Active Problem List   Diagnosis Date Noted  . Thrombocytopenia 10/21/2013  . Chest pain 10/20/2013  . HLD (hyperlipidemia) 07/11/2011  . CAD (coronary artery disease) 07/04/2011  . HTN (hypertension) 07/04/2011  . Bilateral plantar fasciitis   . H/O: gout     History  Smoking status  . Never Smoker   Smokeless tobacco  . Not on file    History  Alcohol Use  . Yes    Comment: Occassional Use    Family History  Problem Relation Age of Onset  . Alzheimer's disease Mother     Review of Systems: Constitutional: no fever chills diaphoresis or fatigue or change in weight.  Head and neck: no hearing loss, no epistaxis, no photophobia or visual disturbance. Respiratory: No cough, shortness of breath or wheezing. Cardiovascular: No chest pain peripheral edema, palpitations. Gastrointestinal: No abdominal distention, no abdominal pain, no change in bowel habits hematochezia or melena. Genitourinary: No dysuria, no frequency, no urgency, no nocturia. Musculoskeletal:No arthralgias, no back pain, no gait disturbance or myalgias. Neurological: No dizziness, no headaches,  no numbness, no seizures, no syncope, no weakness, no tremors. Hematologic: No lymphadenopathy, no easy bruising. Psychiatric: No confusion, no hallucinations, no sleep disturbance.    Physical Exam: Filed Vitals:   02/04/14 0803  BP: 148/92  Pulse: 70  The patient appears to be in no distress.  Head and neck exam reveals that the pupils are equal and reactive.  The extraocular movements are full.  There is no scleral icterus.  Mouth and pharynx are benign.  No lymphadenopathy.  No carotid bruits.  The jugular venous pressure is normal.  Thyroid is not enlarged or tender.  Chest is clear to  percussion and auscultation.  No rales or rhonchi.  Expansion of the chest is symmetrical.  Heart reveals no abnormal lift or heave.  First and second heart sounds are normal.  There is no murmur gallop rub or click.  The abdomen is soft and nontender.  Bowel sounds are normoactive.  There is no hepatosplenomegaly or mass.  There are no abdominal bruits.  Extremities reveal no phlebitis or edema.  Pedal pulses are good.  There is no cyanosis or clubbing.  Neurologic exam is normal strength and no lateralizing weakness.  No sensory deficits.  Integument reveals no rash  EKG today shows normal sinus rhythm with nonspecific ST-T wave changes which are improved since prior tracing.  There are bigeminy PVCs present.  Assessment / Plan: 1.  Ischemic heart disease status post CABG in 1991 with a LIMA to the LAD and status post cardiac catheterization 2013 with drug-eluting stent to the mid circumflex.  Recent cardiac catheterization August 2015 shows widely patent stent and patent LIMA 2. Essential hypertension 3. Hyperlipidemia  Pl an: Continue same medication.  Increase benazepril to 20 mg daily  Recheck in 6 months for office visit.

## 2014-02-04 NOTE — Assessment & Plan Note (Signed)
He has had no further chest pain since last visit.  He continues to have PVCs.  These do not bother him.  He does drink moderate amount of coffee in the mornings

## 2014-02-04 NOTE — Patient Instructions (Signed)
INCREASE BENAZEPRIL TO 20 MG DAILY, [facility]  Your physician wants you to follow-up in: 6 MONTH OV  You will receive a reminder letter in the mail two months in advance. If you don't receive a letter, please call our office to schedule the follow-up appointment.

## 2014-02-04 NOTE — Assessment & Plan Note (Addendum)
The patient is intolerant to all statins and to ezetimibe.  He continues on fish oil capsules.  Will continue regular aerobic exercise and careful diet.

## 2014-02-12 ENCOUNTER — Encounter (HOSPITAL_COMMUNITY): Payer: Self-pay | Admitting: Cardiology

## 2014-07-09 ENCOUNTER — Other Ambulatory Visit: Payer: Self-pay | Admitting: Cardiology

## 2014-08-04 ENCOUNTER — Encounter: Payer: Self-pay | Admitting: Cardiology

## 2014-08-04 ENCOUNTER — Ambulatory Visit (INDEPENDENT_AMBULATORY_CARE_PROVIDER_SITE_OTHER): Payer: Medicare Other | Admitting: Cardiology

## 2014-08-04 VITALS — BP 148/66 | HR 62 | Ht 74.0 in | Wt 186.4 lb

## 2014-08-04 DIAGNOSIS — I10 Essential (primary) hypertension: Secondary | ICD-10-CM | POA: Diagnosis not present

## 2014-08-04 DIAGNOSIS — E785 Hyperlipidemia, unspecified: Secondary | ICD-10-CM | POA: Diagnosis not present

## 2014-08-04 DIAGNOSIS — I2510 Atherosclerotic heart disease of native coronary artery without angina pectoris: Secondary | ICD-10-CM | POA: Diagnosis not present

## 2014-08-04 NOTE — Progress Notes (Signed)
Cardiology Office Note   Date:  08/04/2014   ID:  Devon [name], DOB [birth year], MRN [id number]  PCP:  Minda Meo, MD  Cardiologist: Cassell Clement MD  No chief complaint on file.     History of Present Illness: Devon [name] is a 74 y.o. male who presents for a six-month follow-up office visit  This pleasant 74 year old is seen for a scheduled followup office visit. He has a history of known ischemic heart disease. He had remote CABG in 1991 with a LIMA to the LAD. In 2013 he developed a decrease in exercise tolerance and new anterior T wave changes on his EKG and underwent cardiac catheterization with subsequent DES to the mid circumflex. His ejection fraction was 50%. He has a history of dyslipidemia but is intolerant to statins and also to ezetimibe. In August 2015 he had chest discomfort and was admitted and had cardiac catheterization which showed a widely patent stent to the circumflex and a widely patent LIMA to the LAD and no new areas of stenosis. His left ventricular ejection fraction was 55-65%. He was continued on same medication. Since his admission in August 2015 he has done well with no further chest discomfort. He continues to go to the [facility] 5 days a week and works out. Recently he noticed that his blood pressure was climbing upward.  He then realized that he was drinking excessive amounts of coffee.  He has cut back on his coffee and his blood pressure is starting to improve.  He has not been having any recurrent chest pain or angina. He has been having some osteoarthritic knee problems and is having to be careful as to which exercises he does at the [facility].  Past Medical History  Diagnosis Date  . Coronary artery disease     a. s/p CABG 1991 with LIMA to LAD. b. s/p DES to mid LCX 06/2011. c. Cath 10/2013: severe disease in LAD with competitive flow distal to this from LIMA graft. EF 55-60%. Continued med rx.  . Hypertension   . Hyperlipidemia    statin intolerant; intolerant to zetia  . Bilateral plantar fasciitis   . H/O: gout   . Thrombocytopenia     Past Surgical History  Procedure Laterality Date  . Coronary artery bypass graft  1991    severe 90% ostial LAD lesion with LIMA to LAD  . Cardiac catheterization  April 2013    DES to mid LCX; EF of 50%.   . Left heart catheterization with coronary/graft angiogram N/A 07/03/2011    Procedure: LEFT HEART CATHETERIZATION WITH Isabel Caprice;  Surgeon: Herby Abraham, MD;  Location: [facility] CATH LAB;  Service: Cardiovascular;  Laterality: N/A;  . Left heart catheterization with coronary angiogram N/A 10/21/2013    Procedure: LEFT HEART CATHETERIZATION WITH CORONARY ANGIOGRAM;  Surgeon: Peter M Swaziland, MD;  Location: [facility] CATH LAB;  Service: Cardiovascular;  Laterality: N/A;     Current Outpatient Prescriptions  Medication Sig Dispense Refill  . allopurinol (ZYLOPRIM) 100 MG tablet Take 200 mg by mouth every morning.     Marland Kitchen aspirin EC 81 MG tablet Take 81 mg by mouth daily.    . benazepril (LOTENSIN) 20 MG tablet Take 1 tablet (20 mg total) by mouth daily. 90 tablet 3  . clopidogrel (PLAVIX) 75 MG tablet Take 75 mg by mouth every other day.    Marland Kitchen desonide (DESOWEN) 0.05 % cream Apply 1 application topically daily as needed (for spots).    . fish oil-omega-3  fatty acids 1000 MG capsule Take 1 g by mouth daily.    . metoprolol succinate (TOPROL-XL) 25 MG 24 hr tablet TAKE ONE-HALF TABLET BY MOUTH ONCE DAILY 45 tablet 6  . nitroGLYCERIN (NITROSTAT) 0.4 MG SL tablet Place 1 tablet (0.4 mg total) under the tongue every 5 (five) minutes as needed for chest pain (up to 3 doses). 25 tablet 3   No current facility-administered medications for this visit.    Allergies:   Metoprolol; Statins; and Zetia    Social History:  The patient  reports that he has never smoked. He does not have any smokeless tobacco history on file. He reports that he drinks alcohol. He reports that he does not  use illicit drugs.   Family History:  The patient's family history includes Alzheimer's disease in his mother; Healthy in his sister.    ROS:  Please see the history of present illness.   Otherwise, review of systems are positive for none.   All other systems are reviewed and negative.    PHYSICAL EXAM: VS:  BP 148/66 mmHg  Pulse 62  Ht 6\' 2"  (1.88 m)  Wt 186 lb 6.4 oz (84.55 kg)  BMI 23.92 kg/m2 , BMI Body mass index is 23.92 kg/(m^2). GEN: Well nourished, well developed, in no acute distress HEENT: normal Neck: no JVD, carotid bruits, or masses Cardiac: RRR; no murmurs, rubs, or gallops,no edema  Respiratory:  clear to auscultation bilaterally, normal work of breathing GI: soft, nontender, nondistended, + BS MS: no deformity or atrophy Skin: warm and dry, no rash Neuro:  Strength and sensation are intact Psych: euthymic mood, full affect   EKG:  EKG is not ordered today.    Recent Labs: 10/20/2013: ALT 45 10/21/2013: BUN 14; Creatinine 0.93; Hemoglobin 17.0; Platelets 98*; Potassium 4.1; Sodium 141    Lipid Panel    Component Value Date/Time   CHOL 186 10/21/2013 0410   TRIG 151* 10/21/2013 0410   HDL 41 10/21/2013 0410   CHOLHDL 4.5 10/21/2013 0410   VLDL 30 10/21/2013 0410   LDLCALC 115* 10/21/2013 0410   LDLDIRECT 107.7 07/17/2012 0935      Wt Readings from Last 3 Encounters:  08/04/14 186 lb 6.4 oz (84.55 kg)  02/04/14 188 lb (85.276 kg)  11/04/13 188 lb (85.276 kg)         ASSESSMENT AND PLAN:  1. Ischemic heart disease status post CABG in 1991 with a LIMA to the LAD and status post cardiac catheterization 2013 with drug-eluting stent to the mid circumflex. Recent cardiac catheterization August 2015 shows widely patent stent and patent LIMA 2. Essential hypertension , worse with excessive caffeine 3. Hyperlipidemia  Pl an: Continue current medication.  Avoid excessive caffeine.  Recheck in 6 months for follow-up office visit and EKG   Current  medicines are reviewed at length with the patient today.  The patient does not have concerns regarding medicines.  The following changes have been made:  no change  Labs/ tests ordered today include:  No orders of the defined types were placed in this encounter.       Karie SchwalbeSigned, Thelma Lorenzetti MD 08/04/2014 5:19 PM    [facility] [address], [place], [place]  [address] Phone: [phone]; Fax: [phone]

## 2014-08-04 NOTE — Patient Instructions (Signed)
Medication Instructions:  Your physician recommends that you continue on your current medications as directed. Please refer to the Current Medication list given to you today.  Labwork: none  Testing/Procedures: none  Follow-Up: Your physician wants you to follow-up in: 6 month ov You will receive a reminder letter in the mail two months in advance. If you don't receive a letter, please call our office to schedule the follow-up appointment.   DECREASE YOUR CAFFEINE INTAKE

## 2014-08-28 ENCOUNTER — Other Ambulatory Visit: Payer: Self-pay | Admitting: Cardiology

## 2014-08-31 ENCOUNTER — Other Ambulatory Visit: Payer: Self-pay

## 2015-01-24 ENCOUNTER — Other Ambulatory Visit: Payer: Self-pay | Admitting: Cardiology

## 2015-02-01 ENCOUNTER — Ambulatory Visit: Payer: Medicare Other | Admitting: Cardiology

## 2015-03-11 ENCOUNTER — Ambulatory Visit (INDEPENDENT_AMBULATORY_CARE_PROVIDER_SITE_OTHER): Payer: Medicare Other | Admitting: Cardiology

## 2015-03-11 ENCOUNTER — Encounter: Payer: Self-pay | Admitting: Cardiology

## 2015-03-11 VITALS — BP 150/72 | HR 69 | Ht 74.0 in | Wt 189.4 lb

## 2015-03-11 DIAGNOSIS — E785 Hyperlipidemia, unspecified: Secondary | ICD-10-CM

## 2015-03-11 DIAGNOSIS — I2510 Atherosclerotic heart disease of native coronary artery without angina pectoris: Secondary | ICD-10-CM | POA: Diagnosis not present

## 2015-03-11 DIAGNOSIS — I10 Essential (primary) hypertension: Secondary | ICD-10-CM | POA: Diagnosis not present

## 2015-03-11 MED ORDER — BENAZEPRIL HCL 40 MG PO TABS: 40.0000 mg | ORAL_TABLET | Freq: Every day | ORAL | Status: DC

## 2015-03-11 NOTE — Progress Notes (Signed)
Cardiology Office Note   Date:  03/11/2015   ID:  Devon [name], DOB 08/19/1940, MRN [id number]  PCP:  Minda MeoARONSON,RICHARD A, MD  Cardiologist: Cassell Clementhomas Heru Montz MD  Chief Complaint  Patient presents with  . Hypertension    f/u      History of Present Illness: Devon BearsWallace Hagedorn is a 75 y.o. male who presents for Six-month follow-up visit  This pleasant 75 year old is seen for a scheduled followup office visit. He has a history of known ischemic heart disease. He had remote CABG in 1991 with a LIMA to the LAD. In 2013 he developed a decrease in exercise tolerance and new anterior T wave changes on his EKG and underwent cardiac catheterization with subsequent DES to the mid circumflex. His ejection fraction was 50%. He has a history of dyslipidemia but is intolerant to statins and also to ezetimibe. In August 2015 he had chest discomfort and was admitted and had cardiac catheterization which showed a widely patent stent to the circumflex and a widely patent LIMA to the LAD and no new areas of stenosis. His left ventricular ejection fraction was 55-65%. He was continued on same medication. Since his admission in August 2015 he has done well with no further chest discomfort. [facility] and works out. Recently he noticed that his blood pressure was climbing upward. He then realized that he was drinking excessive amounts of coffee. Despite cutting back on caffeine, his blood pressure at home has still been running high.  He didn't takes an extra Lotensin several days and it seemed to help his blood pressure. He has not been having any recurrent chest pain or angina. He has been having some osteoarthritic knee problems and is having to be careful as to which exercises [facility].  Past Medical History  Diagnosis Date  . Coronary artery disease     a. s/p CABG 1991 with LIMA to LAD. b. s/p DES to mid LCX 06/2011. c. Cath 10/2013: severe disease in LAD  with competitive flow distal to this from LIMA graft. EF 55-60%. Continued med rx.  . Hypertension   . Hyperlipidemia     statin intolerant; intolerant to zetia  . Bilateral plantar fasciitis   . H/O: gout   . Thrombocytopenia [facility](HCC)     Past Surgical History  Procedure Laterality Date  . Coronary artery bypass graft  1991    severe 90% ostial LAD lesion with LIMA to LAD  . Cardiac catheterization  April 2013    DES to mid LCX; EF of 50%.   . Left heart catheterization with coronary/graft angiogram N/A 07/03/2011    Procedure: LEFT HEART CATHETERIZATION WITH Isabel CapriceORONARY/GRAFT ANGIOGRAM;  Surgeon: Herby Abrahamhomas D Stuckey, MD;  [facility];  Service: Cardiovascular;  Laterality: N/A;  . Left heart catheterization with coronary angiogram N/A 10/21/2013    Procedure: LEFT HEART CATHETERIZATION WITH CORONARY ANGIOGRAM;  Surgeon: Peter M SwazilandJordan, MD;  [facility];  Service: Cardiovascular;  Laterality: N/A;     Current Outpatient Prescriptions  Medication Sig Dispense Refill  . allopurinol (ZYLOPRIM) 100 MG tablet Take 100 mg by mouth every morning.     Marland Kitchen. aspirin EC 81 MG tablet Take 81 mg by mouth daily.    . benazepril (LOTENSIN) 40 MG tablet Take 1 tablet (40 mg total) by mouth daily. 90 tablet 3  . clopidogrel (PLAVIX) 75 MG tablet TAKE 1 TABLET BY MOUTH EVERY OTHER DAY  45 tablet 1  . desonide (DESOWEN) 0.05 % cream Apply 1 application topically daily as needed (for spots).    . fish oil-omega-3 fatty acids 1000 MG capsule Take 1 g by mouth daily.    . metoprolol succinate (TOPROL-XL) 25 MG 24 hr tablet TAKE ONE-HALF TABLET BY MOUTH ONCE DAILY 45 tablet 6  . nitroGLYCERIN (NITROSTAT) 0.4 MG SL tablet Place 1 tablet (0.4 mg total) under the tongue every 5 (five) minutes as needed for chest pain (up to 3 doses). 25 tablet 3   No current facility-administered medications for this visit.    Allergies:   Metoprolol; Statins; and Zetia    Social History:  The patient  reports  that he has never smoked. He does not have any smokeless tobacco history on file. He reports that he drinks alcohol. He reports that he does not use illicit drugs.   Family History:  The patient's family history includes Alzheimer's disease in his mother; Healthy in his sister.    ROS:  Please see the history of present illness.   Otherwise, review of systems are positive for none.   All other systems are reviewed and negative.    PHYSICAL EXAM: VS:  BP 150/72 mmHg  Pulse 69  Ht 6\' 2"  (1.88 m)  Wt 189 lb 6.4 oz (85.911 kg)  BMI 24.31 kg/m2 , BMI Body mass index is 24.31 kg/(m^2). GEN: Well nourished, well developed, in no acute distress HEENT: normal Neck: no JVD, carotid bruits, or masses Cardiac: RRR; no murmurs, rubs, or gallops,no edema  Respiratory:  clear to auscultation bilaterally, normal work of breathing GI: soft, nontender, nondistended, + BS MS: no deformity or atrophy Skin: warm and dry, no rash Neuro:  Strength and sensation are intact Psych: euthymic mood, full affect   EKG:  EKG is ordered today. The ekg ordered today demonstrates Normal sinus rhythm at 69 bpm.  Nonspecific ST-T wave abnormalities, unchanged.  Since prior tracing of 02/04/14, PVCs have dis- appeared   Recent Labs: No results found for requested labs within last 365 days.    Lipid Panel    Component Value Date/Time   CHOL 186 10/21/2013 0410   TRIG 151* 10/21/2013 0410   HDL 41 10/21/2013 0410   CHOLHDL 4.5 10/21/2013 0410   VLDL 30 10/21/2013 0410   LDLCALC 115* 10/21/2013 0410   LDLDIRECT 107.7 07/17/2012 0935      Wt Readings from Last 3 Encounters:  03/11/15 189 lb 6.4 oz (85.911 kg)  08/04/14 186 lb 6.4 oz (84.55 kg)  02/04/14 188 lb (85.276 kg)         ASSESSMENT AND PLAN:  1. Ischemic heart disease status post CABG in 1991 with a LIMA to the LAD and status post cardiac catheterization 2013 with drug-eluting stent to the mid circumflex. Recent cardiac catheterization  August 2015 shows widely patent stent and patent LIMA.Doing well.  Continue current medication 2. Essential hypertension , worse with excessive caffeine.Blood pressure still running high.  We will increase his Lotensin from 20 mg up to 40 mg daily 3. Hyperlipidemia  Pl an: Marland Kitchen Recheck in 6 months for follow-up office visit and EKG   Current medicines are reviewed at length with the patient today.  The patient does not have concerns regarding medicines.  The following changes have been made:  no change  Labs/ tests ordered today include:   Orders Placed This Encounter  Procedures  . EKG 12-Lead    Disposition: Return in 6 months for office  visit with Dr. Peter Swaziland   Signed, Cassell Clement MD 03/11/2015 9:31 AM    [facility] [address] [place], [place], [place]  [address] Phone: [phone]; Fax: [phone]

## 2015-03-11 NOTE — Patient Instructions (Signed)
Medication Instructions:  INCREASE YOUR LOTENSIN TO 40 MG BY MOUTH DAILY   Labwork: NONE  Testing/Procedures: NONE  Follow-Up: Your physician wants you to follow-up in: 6 MONTH OV [facility] You will receive a reminder letter in the mail two months in advance. If you don't receive a letter, please call our office to schedule the follow-up appointment.  If you need a refill on your cardiac medications before your next appointment, please call your pharmacy.

## 2015-04-22 ENCOUNTER — Other Ambulatory Visit: Payer: Self-pay | Admitting: *Deleted

## 2015-04-22 MED ORDER — CLOPIDOGREL BISULFATE 75 MG PO TABS: 75.0000 mg | ORAL_TABLET | ORAL | Status: DC

## 2015-10-01 ENCOUNTER — Other Ambulatory Visit: Payer: Self-pay | Admitting: *Deleted

## 2015-10-01 MED ORDER — METOPROLOL SUCCINATE ER 25 MG PO TB24: 12.5000 mg | ORAL_TABLET | Freq: Every day | ORAL | 1 refills | Status: DC

## 2015-10-04 NOTE — Progress Notes (Signed)
Cardiology Office Note   Date:  10/05/2015   ID:  Devon [name], DOB [birth year], MRN [id number]  PCP:  Minda Meo, MD  Cardiologist: Cassell Clement MD  Chief Complaint  Patient presents with  . Follow-up    6 months--former Dr. Patty Sermons pt  pt c/o dizziness--allergies  . Coronary Artery Disease      History of Present Illness: Devon [name] is a 75 y.o. male who presents for follow up CAD. He is a former patient of Dr. Patty Sermons.   He has a history of known ischemic heart disease. He had remote CABG in 1991 with a LIMA to the LAD. In 2013 he developed a decrease in exercise tolerance and new anterior T wave changes on his EKG and underwent cardiac catheterization with subsequent DES to the mid circumflex. His ejection fraction was 50%. He has a history of dyslipidemia but is intolerant to statins and also to ezetimibe. In August 2015 he had chest discomfort and was admitted and had cardiac catheterization which showed a widely patent stent to the circumflex and a widely patent LIMA to the LAD and no new areas of stenosis. His left ventricular ejection fraction was 55-65%. He was continued on same medication.  Last year BP was running higher. Lotensin dose was increased. He made significant dietary changes and reduced his caffeine intake with good response. The only time his BP has been high was recently when he was taking NSAIDs for back pain.  He has not been having any recurrent chest pain or angina. [facility]    Past Medical History:  Diagnosis Date  . Bilateral plantar fasciitis   . Coronary artery disease    a. s/p CABG 1991 with LIMA to LAD. b. s/p DES to mid LCX 06/2011. c. Cath 10/2013: severe disease in LAD with competitive flow distal to this from LIMA graft. EF 55-60%. Continued med rx.  . H/O: gout   . Hyperlipidemia    statin intolerant; intolerant to zetia  . Hypertension   . Thrombocytopenia (HCC)       Past Surgical History:  Procedure Laterality Date  . CARDIAC CATHETERIZATION  April 2013   DES to mid LCX; EF of 50%.   . CORONARY ARTERY BYPASS GRAFT  1991   severe 90% ostial LAD lesion with LIMA to LAD  . LEFT HEART CATHETERIZATION WITH CORONARY ANGIOGRAM N/A 10/21/2013   Procedure: LEFT HEART CATHETERIZATION WITH CORONARY ANGIOGRAM;  Surgeon: Lashya Passe M Swaziland, MD;  Location[facility] LAB;  Service: Cardiovascular;  Laterality: N/A;  . LEFT HEART CATHETERIZATION WITH CORONARY/GRAFT ANGIOGRAM N/A 07/03/2011   Procedure: LEFT HEART CATHETERIZATION WITH Isabel Caprice;  Surgeon: Herby Abraham, MD;  [facility] CATH LAB;  Service: Cardiovascular;  Laterality: N/A;     Current Outpatient Prescriptions  Medication Sig Dispense Refill  . allopurinol (ZYLOPRIM) 100 MG tablet Take 100 mg by mouth every morning.     Marland Kitchen aspirin EC 81 MG tablet Take 81 mg by mouth daily.    . benazepril (LOTENSIN) 40 MG tablet Take 1 tablet (40 mg total) by mouth daily. 90 tablet 3  . clopidogrel (PLAVIX) 75 MG tablet Take 1 tablet (75 mg total) by mouth every other day. 45 tablet 3  . desonide (DESOWEN) 0.05 % cream Apply 1 application topically daily as needed (for spots).    . fish oil-omega-3 fatty acids 1000 MG capsule Take 1 g by mouth daily.    Marland Kitchen  metoprolol succinate (TOPROL-XL) 25 MG 24 hr tablet Take 0.5 tablets (12.5 mg total) by mouth daily. 45 tablet 1  . nitroGLYCERIN (NITROSTAT) 0.4 MG SL tablet Place 1 tablet (0.4 mg total) under the tongue every 5 (five) minutes as needed for chest pain (up to 3 doses). 25 tablet 3   No current facility-administered medications for this visit.     Allergies:   Metoprolol; Statins; and Zetia [ezetimibe]    Social History:  The patient  reports that he has never smoked. He does not have any smokeless tobacco history on file. He reports that he drinks alcohol. He reports that he does not use drugs.   Family History:  The patient's family history  includes Alzheimer's disease in his mother; Healthy in his sister.    ROS:  Please see the history of present illness.   Otherwise, review of systems are positive for none.   All other systems are reviewed and negative.    PHYSICAL EXAM: VS:  BP (!) 152/80 (BP Location: Left Arm, Patient Position: Sitting, Cuff Size: Normal)   Pulse 68   Ht 6\' 2"  (1.88 m)   Wt 185 lb 3.2 oz (84 kg)   BMI 23.78 kg/m  , BMI Body mass index is 23.78 kg/m. GEN: Well nourished, well developed, in no acute distress  HEENT: normal  Neck: no JVD, carotid bruits, or masses Cardiac: RRR; no murmurs, rubs, or gallops,no edema  Respiratory:  clear to auscultation bilaterally, normal work of breathing GI: soft, nontender, nondistended, + BS MS: no deformity or atrophy  Skin: warm and dry, no rash Neuro:  Strength and sensation are intact Psych: euthymic mood, full affect   EKG:  EKG is not ordered today.    Recent Labs: No results found for requested labs within last 8760 hours.    Lipid Panel    Component Value Date/Time   CHOL 186 10/21/2013 0410   TRIG 151 (H) 10/21/2013 0410   HDL 41 10/21/2013 0410   CHOLHDL 4.5 10/21/2013 0410   VLDL 30 10/21/2013 0410   LDLCALC 115 (H) 10/21/2013 0410   LDLDIRECT 107.7 07/17/2012 0935      Wt Readings from Last 3 Encounters:  10/05/15 185 lb 3.2 oz (84 kg)  03/11/15 189 lb 6.4 oz (85.9 kg)  08/04/14 186 lb 6.4 oz (84.6 kg)      Labs from Dr. Jacky Kindle dated 08/25/15: cholesterol 196, triglycerides 298, HDL 32, LDL 104. A1c 5.0%. Chemistries, TSH, and Hgb all normal.    ASSESSMENT AND PLAN:  1. Ischemic heart disease status post CABG in 1991 with a LIMA to the LAD and status post cardiac catheterization 2013 with drug-eluting stent to the mid circumflex. Last cardiac catheterization August 2015 shows widely patent stent and patent LIMA.Doing well.  Continue current medication. He is asymptomatic. 2. Essential hypertension. He brings extensive  records from home demonstrating good BP control. Recent elevation related to NSAIDs. Recommend he avoid these and use Tylenol for pain. 3. Hyperlipidemia- intolerant of statins and Zetia. Elevated triglycerides. Continue dietary modification and take fish oil 4 grams daily.   Pl an: Marland Kitchen Recheck in 6 months for follow-up office visit and EKG   Current medicines are reviewed at length with the patient today.  The patient does not have concerns regarding medicines.  The following changes have been made: Fish oil 4 grams daily.  Labs/ tests ordered today include:   No orders of the defined types were placed in this encounter.   Disposition:  Return in 6 months for office visit    Signed, Harim Bi Swaziland MD, [facility]   10/05/2015 10:42 AM    [facility]

## 2015-10-05 ENCOUNTER — Ambulatory Visit (INDEPENDENT_AMBULATORY_CARE_PROVIDER_SITE_OTHER): Payer: Medicare Other | Admitting: Cardiology

## 2015-10-05 ENCOUNTER — Encounter: Payer: Self-pay | Admitting: Cardiology

## 2015-10-05 VITALS — BP 152/80 | HR 68 | Ht 74.0 in | Wt 185.2 lb

## 2015-10-05 DIAGNOSIS — E785 Hyperlipidemia, unspecified: Secondary | ICD-10-CM

## 2015-10-05 DIAGNOSIS — I10 Essential (primary) hypertension: Secondary | ICD-10-CM | POA: Diagnosis not present

## 2015-10-05 DIAGNOSIS — I2510 Atherosclerotic heart disease of native coronary artery without angina pectoris: Secondary | ICD-10-CM | POA: Diagnosis not present

## 2015-10-05 NOTE — Patient Instructions (Signed)
Continue your current therapy  Increase fish oil to 4 grams daily for elevated triglycerides  I will see you in 6 months

## 2016-04-06 ENCOUNTER — Other Ambulatory Visit: Payer: Self-pay

## 2016-04-06 MED ORDER — METOPROLOL SUCCINATE ER 25 MG PO TB24: 12.5000 mg | ORAL_TABLET | Freq: Every day | ORAL | 1 refills | Status: DC

## 2016-04-11 NOTE — Progress Notes (Signed)
Cardiology Office Note   Date:  04/12/2016   ID:  Jesse SansWallace Tyron Rumble, DOB 03/29/1940, MRN [id number]  PCP:  Minda MeoARONSON,RICHARD A, MD  Cardiologist: Ingram Onnen [place]Jordan MD  Chief Complaint  Patient presents with  . Follow-up    6 months; Pt states no Sx.   . Coronary Artery Disease      History of Present Illness: Devon [name] is a 76 y.o. male who presents for follow up CAD.   He has a history of known ischemic heart disease. He had remote CABG in 1991 with a LIMA to the LAD. In 2013 he developed a decrease in exercise tolerance and new anterior T wave changes on his EKG and underwent cardiac catheterization with subsequent DES to the mid circumflex. His ejection fraction was 50%. He has a history of dyslipidemia but is intolerant to statins and also to ezetimibe. In August 2015 he had chest discomfort and was admitted and had cardiac catheterization which showed a widely patent stent to the circumflex and a widely patent LIMA to the LAD and no new areas of stenosis. His left ventricular ejection fraction was 55-65%. He was continued on same medication.  On follow up today he is doing very well. He denies any chest pain or SOB.  [facility] is very [facility]    Past Medical History:  Diagnosis Date  . Bilateral plantar fasciitis   . Coronary artery disease    a. s/p CABG 1991 with LIMA to LAD. b. s/p DES to mid LCX 06/2011. c. Cath 10/2013: severe disease in LAD with competitive flow distal to this from LIMA graft. EF 55-60%. Continued med rx.  . H/O: gout   . Hyperlipidemia    statin intolerant; intolerant to zetia  . Hypertension   . Thrombocytopenia (HCC)     Past Surgical History:  Procedure Laterality Date  . CARDIAC CATHETERIZATION  April 2013   DES to mid LCX; EF of 50%.   . CORONARY ARTERY BYPASS GRAFT  1991   severe 90% ostial LAD lesion with LIMA to LAD  . LEFT HEART CATHETERIZATION WITH CORONARY ANGIOGRAM N/A 10/21/2013   Procedure: LEFT HEART CATHETERIZATION WITH CORONARY ANGIOGRAM;  Surgeon: Kenidee Cregan M [place]Jordan, MD;  [facility];  Service: Cardiovascular;  Laterality: N/A;  . LEFT HEART CATHETERIZATION WITH CORONARY/GRAFT ANGIOGRAM N/A 07/03/2011   Procedure: LEFT HEART CATHETERIZATION WITH Isabel CapriceORONARY/GRAFT ANGIOGRAM;  Surgeon: Herby Abrahamhomas D Stuckey, MD;  [facility];  Service: Cardiovascular;  Laterality: N/A;     Current Outpatient Prescriptions  Medication Sig Dispense Refill  . allopurinol (ZYLOPRIM) 100 MG tablet Take 100 mg by mouth every morning.     Marland Kitchen. aspirin EC 81 MG tablet Take 81 mg by mouth daily.    . benazepril (LOTENSIN) 40 MG tablet Take 1 tablet (40 mg total) by mouth daily. 90 tablet 3  . desonide (DESOWEN) 0.05 % cream Apply 1 application topically daily as needed (for spots).    . fish oil-omega-3 fatty acids 1000 MG capsule Take 1 g by mouth daily.    . metoprolol succinate (TOPROL-XL) 25 MG 24 hr tablet Take 0.5 tablets (12.5 mg total) by mouth daily. 45 tablet 1  . nitroGLYCERIN (NITROSTAT) 0.4 MG SL tablet Place 1 tablet (0.4 mg total) under the tongue every 5 (five) minutes as needed for chest pain (up to 3 doses). 25 tablet 3   No current facility-administered medications for this visit.  Allergies:   Metoprolol; Statins; and Zetia [ezetimibe]    Social History:  The patient  reports that he has never smoked. He has never used smokeless tobacco. He reports that he drinks alcohol. He reports that he does not use drugs.   Family History:  The patient's family history includes Alzheimer's disease in his mother; Healthy in his sister.    ROS:  Please see the history of present illness.   Otherwise, review of systems are positive for none.   All other systems are reviewed and negative.    PHYSICAL EXAM: VS:  BP 130/82   Pulse 70   Ht 6\' 1"  (1.854 m)   Wt 185 lb 6.4 oz (84.1 kg)   BMI 24.46 kg/m  , BMI Body mass index is 24.46 kg/m. GEN: Well nourished, well  developed, in no acute distress  HEENT: normal  Neck: no JVD, carotid bruits, or masses Cardiac: RRR; no murmurs, rubs, or gallops,no edema  Respiratory:  clear to auscultation bilaterally, normal work of breathing GI: soft, nontender, nondistended, + BS MS: no deformity or atrophy  Skin: warm and dry, no rash Neuro:  Strength and sensation are intact Psych: euthymic mood, full affect   EKG:  EKG is  ordered today. NSR with nonspecific ST-T changes. I have personally reviewed and interpreted this study.   Recent Labs: No results found for requested labs within last 8760 hours.    Lipid Panel    Component Value Date/Time   CHOL 186 10/21/2013 0410   TRIG 151 (H) 10/21/2013 0410   HDL 41 10/21/2013 0410   CHOLHDL 4.5 10/21/2013 0410   VLDL 30 10/21/2013 0410   LDLCALC 115 (H) 10/21/2013 0410   LDLDIRECT 107.7 07/17/2012 0935      Wt Readings from Last 3 Encounters:  04/12/16 185 lb 6.4 oz (84.1 kg)  10/05/15 185 lb 3.2 oz (84 kg)  03/11/15 189 lb 6.4 oz (85.9 kg)      Labs from Dr. Jacky Kindle dated 08/25/15: cholesterol 196, triglycerides 298, HDL 32, LDL 104. A1c 5.0%. Chemistries, TSH, and Hgb all normal.    ASSESSMENT AND PLAN:  1. Ischemic heart disease status post CABG in 1991 with a LIMA to the LAD and status post cardiac catheterization 2013 with drug-eluting stent to the mid circumflex. Last cardiac catheterization August 2015 shows widely patent stent and patent LIMA. He is asymptomatic.  Will discontinue Plavix. Continue ASA 81 mg daily. 2. Essential hypertension. Well controlled.  3. Hyperlipidemia- intolerant of statins and Zetia. Elevated triglycerides. Continue dietary modification and take fish oil 4 grams daily. We will see if he is a candidate for one of our lipid lowering trials.   Pl an: Marland Kitchen Recheck in 6 months for follow-up office visit    Current medicines are reviewed at length with the patient today.  The patient does not have concerns regarding  medicines.  The following changes have been made: stop plavix  Labs/ tests ordered today include:   Orders Placed This Encounter  Procedures  . EKG 12-Lead    Disposition: Return in 6 months for office visit    Signed, Keiona Jenison [place] MD, [facility]   04/12/2016 9:10 AM    [facility]

## 2016-04-12 ENCOUNTER — Ambulatory Visit (INDEPENDENT_AMBULATORY_CARE_PROVIDER_SITE_OTHER): Payer: Medicare Other | Admitting: Cardiology

## 2016-04-12 ENCOUNTER — Encounter: Payer: Self-pay | Admitting: Cardiology

## 2016-04-12 VITALS — BP 130/82 | HR 70 | Ht 73.0 in | Wt 185.4 lb

## 2016-04-12 DIAGNOSIS — I2510 Atherosclerotic heart disease of native coronary artery without angina pectoris: Secondary | ICD-10-CM | POA: Diagnosis not present

## 2016-04-12 DIAGNOSIS — E7800 Pure hypercholesterolemia, unspecified: Secondary | ICD-10-CM

## 2016-04-12 DIAGNOSIS — I10 Essential (primary) hypertension: Secondary | ICD-10-CM

## 2016-04-12 NOTE — Patient Instructions (Signed)
Stop taking Plavix. Continue your other therapy  I will see you in 6 months.    

## 2016-04-18 ENCOUNTER — Other Ambulatory Visit: Payer: Self-pay

## 2016-04-18 MED ORDER — METOPROLOL SUCCINATE ER 25 MG PO TB24: 12.5000 mg | ORAL_TABLET | Freq: Every day | ORAL | 3 refills | Status: DC

## 2016-04-26 ENCOUNTER — Telehealth: Payer: Self-pay | Admitting: Pharmacist

## 2016-04-26 NOTE — Telephone Encounter (Signed)
Talked to patient today. He is not interested on any clinical trial at this time. Nor interested in PCSK9 trial.  He will continue current therapy and lifestyle modifications.    Encouraged to call back if any questions or if we can help with therapy in future.

## 2016-09-29 ENCOUNTER — Encounter: Payer: Self-pay | Admitting: *Deleted

## 2016-10-16 NOTE — Progress Notes (Signed)
Cardiology Office Note   Date:  10/17/2016   ID:  Devon [name], Devon [name] 26-Jan-1941, MRN [id number]  PCP:  Geoffry [place], MD  Cardiologist: Bryor Rami [place] MD  Chief Complaint  Patient presents with  . Coronary Artery Disease      History of Present Illness: Devon [name] is a 76 y.o. male who presents for follow up CAD.   He has a history of known ischemic heart disease. He had remote CABG in 1991 with a LIMA to the LAD. In 2013 he developed a decrease in exercise tolerance and new anterior T wave changes on his EKG and underwent cardiac catheterization with subsequent DES to the mid circumflex. His ejection fraction was 50%. He has a history of dyslipidemia but is intolerant to statins and also to ezetimibe. In August 2015 he had chest discomfort and was admitted and had cardiac catheterization which showed a widely patent stent to the circumflex and a widely patent LIMA to the LAD and no new areas of stenosis. His left ventricular ejection fraction was 55-65%. He was continued on same medication.  On follow up today he reports having bad plantar fasciitis for 3 months. Just now getting over that and resuming walking on treadmill.  He denies any chest pain or SOB.  He admits he was doing very poorly with diet eating a lot of animal fat. Now eating more beans and fruits. BP at home well controlled 115-130 systolic, 65-70 diastolic.    Past Medical History:  Diagnosis Date  . Bilateral plantar fasciitis   . Coronary artery disease    a. s/p CABG 1991 with LIMA to LAD. b. s/p DES to mid LCX 06/2011. c. Cath 10/2013: severe disease in LAD with competitive flow distal to this from LIMA graft. EF 55-60%. Continued med rx.  . H/O: gout   . Hyperlipidemia    statin intolerant; intolerant to zetia  . Hypertension   . Thrombocytopenia (HCC)     Past Surgical History:  Procedure Laterality Date  . CARDIAC CATHETERIZATION  April 2013   DES to mid LCX; EF of 50%.   .  CORONARY ARTERY BYPASS GRAFT  1991   severe 90% ostial LAD lesion with LIMA to LAD  . LEFT HEART CATHETERIZATION WITH CORONARY ANGIOGRAM N/A 10/21/2013   Procedure: LEFT HEART CATHETERIZATION WITH CORONARY ANGIOGRAM;  Surgeon: Theordore Cisnero M [place], MD;  [facility];  Service: Cardiovascular;  Laterality: N/A;  . LEFT HEART CATHETERIZATION WITH CORONARY/GRAFT ANGIOGRAM N/A 07/03/2011   Procedure: LEFT HEART CATHETERIZATION WITH Isabel Caprice;  Surgeon: Herby Abraham, MD;  Location: [facility] C[facility] LAB;  Service: Cardiovascular;  Laterality: N/A;     Current Outpatient Prescriptions  Medication Sig Dispense Refill  . allopurinol (ZYLOPRIM) 100 MG tablet Take 100 mg by mouth 2 (two) times daily.     Marland Kitchen aspirin EC 81 MG tablet Take 81 mg by mouth daily.    . benazepril (LOTENSIN) 40 MG tablet Take 1 tablet (40 mg total) by mouth daily. 90 tablet 3  . desonide (DESOWEN) 0.05 % cream Apply 1 application topically daily as needed (for spots).    . fish oil-omega-3 fatty acids 1000 MG capsule Take 1 g by mouth daily.    . metoprolol succinate (TOPROL-XL) 25 MG 24 hr tablet Take 0.5 tablets (12.5 mg total) by mouth daily. 45 tablet 3  . nitroGLYCERIN (NITROSTAT) 0.4 MG SL tablet Place 0.4 mg under the tongue every 5 (five) minutes as needed for chest pain.  No current facility-administered medications for this visit.     Allergies:   Metoprolol; Statins; and Zetia [ezetimibe]    Social History:  The patient  reports that he has never smoked. He has never used smokeless tobacco. He reports that he drinks alcohol. He reports that he does not use drugs.   Family History:  The patient's family history includes Alzheimer's disease in his mother; Healthy in his sister.    ROS:  Please see the history of present illness.   Otherwise, review of systems are positive for none.   All other systems are reviewed and negative.    PHYSICAL EXAM: VS:  BP (!) 158/79   Pulse 65   Ht 6' 1.5" (1.867  m)   Wt 185 lb 3.2 oz (84 kg)   SpO2 98%   BMI 24.10 kg/m  , BMI Body mass index is 24.1 kg/m. GENERAL:  Well appearing WM in NAD HEENT:  PERRL, EOMI, sclera are clear. Oropharynx is clear. NECK:  No jugular venous distention, carotid upstroke brisk and symmetric, no bruits, no thyromegaly or adenopathy LUNGS:  Clear to auscultation bilaterally CHEST:  Unremarkable HEART:  RRR,  PMI not displaced or sustained,S1 and S2 within normal limits, no S3, no S4: no clicks, no rubs, no murmurs ABD:  Soft, nontender. BS +, no masses or bruits. No hepatomegaly, no splenomegaly EXT:  2 + pulses throughout, no edema, no cyanosis no clubbing SKIN:  Warm and dry.  No rashes NEURO:  Alert and oriented x 3. Cranial nerves II through XII intact. PSYCH:  Cognitively intact     EKG:  EKG is not ordered today.    Recent Labs: No results found for requested labs within last 8760 hours.    Lipid Panel    Component Value Date/Time   CHOL 186 10/21/2013 0410   TRIG 151 (H) 10/21/2013 0410   HDL 41 10/21/2013 0410   CHOLHDL 4.5 10/21/2013 0410   VLDL 30 10/21/2013 0410   LDLCALC 115 (H) 10/21/2013 0410   LDLDIRECT 107.7 07/17/2012 0935      Wt Readings from Last 3 Encounters:  10/17/16 185 lb 3.2 oz (84 kg)  04/12/16 185 lb 6.4 oz (84.1 kg)  10/05/15 185 lb 3.2 oz (84 kg)      Labs  dated 08/25/15: cholesterol 196, triglycerides 298, HDL 32, LDL 104. A1c 5.0%. Chemistries, TSH, and Hgb all normal.  Dated 09/07/16: cholesterol 218, triglycerides 200, HDL 39, LDL 139. Creatinine 1.1. Hgb 17.5, TSH and ALT normal.  ASSESSMENT AND PLAN:  1. Ischemic heart disease status post CABG in 1991 with a LIMA to the LAD and status post cardiac catheterization 2013 with drug-eluting stent to the mid circumflex. Last cardiac catheterization August 2015 shows widely patent stent and patent LIMA. He is asymptomatic.  Will discontinue Plavix. Continue ASA 81 mg daily. 2. Essential hypertension. Well  controlled. On low dose Toprol XL 3. Hyperlipidemia- intolerant of statins and Zetia. Recent LDL higher with poor diet but even before LDL was not at goal at 104.  Continue dietary modification and take fish oil 4 grams daily. I have asked him to meet with our Pharm D to see if we can initiate a PCSK 9 inhibitor.   Pl an: .follow up in 6 months.   Current medicines are reviewed at length with the patient today.  The patient does not have concerns regarding medicines.  The following changes have been made: stop plavix  Labs/ tests ordered today include:  No orders of the defined types were placed in this encounter.  Signed, Kengo Sturges [place] MD, [facility]   10/17/2016 7:56 AM    [facility]

## 2016-10-17 ENCOUNTER — Encounter: Payer: Self-pay | Admitting: Cardiology

## 2016-10-17 ENCOUNTER — Ambulatory Visit (INDEPENDENT_AMBULATORY_CARE_PROVIDER_SITE_OTHER): Payer: Medicare Other | Admitting: Cardiology

## 2016-10-17 VITALS — BP 158/79 | HR 65 | Ht 73.5 in | Wt 185.2 lb

## 2016-10-17 DIAGNOSIS — I2510 Atherosclerotic heart disease of native coronary artery without angina pectoris: Secondary | ICD-10-CM

## 2016-10-17 DIAGNOSIS — I10 Essential (primary) hypertension: Secondary | ICD-10-CM

## 2016-10-17 NOTE — Patient Instructions (Signed)
Continue your current therapy  We will evaluate if you can afford one of the new cholesterol lowering drugs.  I will see you in 6 months.

## 2016-10-26 ENCOUNTER — Ambulatory Visit: Payer: Medicare Other

## 2017-07-04 ENCOUNTER — Telehealth: Payer: Self-pay | Admitting: Cardiology

## 2017-07-04 MED ORDER — METOPROLOL SUCCINATE ER 25 MG PO TB24: 12.5000 mg | ORAL_TABLET | Freq: Every day | ORAL | 0 refills | Status: DC

## 2017-07-04 NOTE — Telephone Encounter (Signed)
New message    *STAT* If patient is at the pharmacy, call can be transferred to refill team.   1. Which medications need to be refilled? (please list name of each medication and dose if known) metoprolol succinate (TOPROL-XL) 25 MG 24 hr tablet  2. Which pharmacy/location (including street and city if local pharmacy) is medication to be sent to?[id number] - [place], [place] - [address] AT [facility] [address]  3. Do they need a 30 day or 90 day supply? 90

## 2017-10-01 ENCOUNTER — Telehealth: Payer: Self-pay | Admitting: Cardiology

## 2017-10-01 MED ORDER — METOPROLOL SUCCINATE ER 25 MG PO TB24: 12.5000 mg | ORAL_TABLET | Freq: Every day | ORAL | 0 refills | Status: DC

## 2017-10-01 NOTE — Telephone Encounter (Signed)
New Message:        *STAT* If patient is at the pharmacy, call can be transferred to refill team.   1. Which medications need to be refilled? (please list name of each medication and dose if known) metoprolol succinate (TOPROL-XL) 25 MG 24 hr tablet  2. Which pharmacy/location (including street and city if local pharmacy) is medication to be sent to?[id number] - [place], [place] - [address] [facility] OF [address]  3. Do they need a 30 day or 90 day supply? 30

## 2017-10-01 NOTE — Telephone Encounter (Signed)
Refill sent to the pharmacy electronically.  

## 2017-10-02 ENCOUNTER — Other Ambulatory Visit: Payer: Self-pay

## 2017-10-28 NOTE — Progress Notes (Signed)
Cardiology Office Note   Date:  11/02/2017   ID:  Devon [name], DOB 05/07/1940, MRN [id number]  PCP:  Devon ParadiseAronson, Richard, MD  Cardiologist: Devon Fabel [place] MD  Chief Complaint  Patient presents with  . Follow-up  . Coronary Artery Disease      History of Present Illness: Devon [name] is a 77 y.o. male who presents for follow up CAD.   He has a history of known ischemic heart disease. He had remote CABG in 1991 with a LIMA to the LAD. In 2013 he developed a decrease in exercise tolerance and new anterior T wave changes on his EKG and underwent cardiac catheterization with subsequent DES to the mid circumflex. His ejection fraction was 50%. He has a history of dyslipidemia but is intolerant to statins and also to ezetimibe. In August 2015 he had chest discomfort and was admitted and had cardiac catheterization which showed a widely patent stent to the circumflex and a widely patent LIMA to the LAD and no new areas of stenosis. His left ventricular ejection fraction was 55-65%. He was continued on same medication.  On follow up today he reports he is doing very well. [facility] No limitations. Denies any chest pain or dyspnea. Eating healthier. BP typically 122-138 systolic.   Past Medical History:  Diagnosis Date  . Bilateral plantar fasciitis   . Coronary artery disease    a. s/p CABG 1991 with LIMA to LAD. b. s/p DES to mid LCX 06/2011. c. Cath 10/2013: severe disease in LAD with competitive flow distal to this from LIMA graft. EF 55-60%. Continued med rx.  . H/O: gout   . Hyperlipidemia    statin intolerant; intolerant to zetia  . Hypertension   . Thrombocytopenia (HCC)     Past Surgical History:  Procedure Laterality Date  . CARDIAC CATHETERIZATION  April 2013   DES to mid LCX; EF of 50%.   . CORONARY ARTERY BYPASS GRAFT  1991   severe 90% ostial LAD lesion with LIMA to LAD  . LEFT HEART CATHETERIZATION WITH CORONARY ANGIOGRAM N/A  10/21/2013   Procedure: LEFT HEART CATHETERIZATION WITH CORONARY ANGIOGRAM;  Surgeon: Darrielle Pflieger M [place]Jordan, MD;  [facility];  Service: Cardiovascular;  Laterality: N/A;  . LEFT HEART CATHETERIZATION WITH CORONARY/GRAFT ANGIOGRAM N/A 07/03/2011   Procedure: LEFT HEART CATHETERIZATION WITH Isabel CapriceORONARY/GRAFT ANGIOGRAM;  Surgeon: Herby Abrahamhomas D Stuckey, MD;  [facility];  Service: Cardiovascular;  Laterality: N/A;     Current Outpatient Medications  Medication Sig Dispense Refill  . allopurinol (ZYLOPRIM) 100 MG tablet Take 100 mg by mouth 2 (two) times daily.     Marland Kitchen. aspirin EC 81 MG tablet Take 81 mg by mouth daily.    . benazepril (LOTENSIN) 40 MG tablet Take 1 tablet (40 mg total) by mouth daily. 90 tablet 3  . desonide (DESOWEN) 0.05 % cream Apply 1 application topically daily as needed (for spots).    . fish oil-omega-3 fatty acids 1000 MG capsule Take 1 g by mouth daily.    . metoprolol succinate (TOPROL-XL) 25 MG 24 hr tablet Take 0.5 tablets (12.5 mg total) by mouth daily. 45 tablet 0  . nitroGLYCERIN (NITROSTAT) 0.4 MG SL tablet Place 0.4 mg under the tongue every 5 (five) minutes as needed for chest pain.     No current facility-administered medications for this visit.     Allergies:   Metoprolol; Statins; and Zetia [ezetimibe]    Social  History:  The patient  reports that he has never smoked. He has never used smokeless tobacco. He reports that he drinks alcohol. He reports that he does not use drugs.   Family History:  The patient's family history includes Alzheimer's disease in his mother; Healthy in his sister.    ROS:  Please see the history of present illness.   Otherwise, review of systems are positive for none.   All other systems are reviewed and negative.    PHYSICAL EXAM: VS:  BP (!) 151/81   Pulse 76   Ht 6\' 2"  (1.88 m)   Wt 186 lb (84.4 kg)   BMI 23.88 kg/m  , BMI Body mass index is 23.88 kg/m. GENERAL:  Well appearing WM appears younger than stated  age HEENT:  PERRL, EOMI, sclera are clear. Oropharynx is clear. NECK:  No jugular venous distention, carotid upstroke brisk and symmetric, no bruits, no thyromegaly or adenopathy LUNGS:  Clear to auscultation bilaterally CHEST:  Unremarkable HEART:  RRR,  PMI not displaced or sustained,S1 and S2 within normal limits, no S3, no S4: no clicks, no rubs, no murmurs ABD:  Soft, nontender. BS +, no masses or bruits. No hepatomegaly, no splenomegaly EXT:  2 + pulses throughout, no edema, no cyanosis no clubbing SKIN:  Warm and dry.  No rashes NEURO:  Alert and oriented x 3. Cranial nerves II through XII intact. PSYCH:  Cognitively intact   EKG:  EKG is ordered today. NSR with nonspecific ST-T wave abnormality. No change from prior. I have personally reviewed and interpreted this study.    Recent Labs: No results found for requested labs within last 8760 hours.    Lipid Panel    Component Value Date/Time   CHOL 186 10/21/2013 0410   TRIG 151 (H) 10/21/2013 0410   HDL 41 10/21/2013 0410   CHOLHDL 4.5 10/21/2013 0410   VLDL 30 10/21/2013 0410   LDLCALC 115 (H) 10/21/2013 0410   LDLDIRECT 107.7 07/17/2012 0935      Wt Readings from Last 3 Encounters:  11/02/17 186 lb (84.4 kg)  10/17/16 185 lb 3.2 oz (84 kg)  04/12/16 185 lb 6.4 oz (84.1 kg)      Labs  dated 08/25/15: cholesterol 196, triglycerides 298, HDL 32, LDL 104. A1c 5.0%. Chemistries, TSH, and Hgb all normal.  Dated 09/07/16: cholesterol 218, triglycerides 200, HDL 39, LDL 139. Creatinine 1.1. Hgb 17.5, TSH and ALT normal. Dated 09/10/17: cholesterol 207, triglycerides 249, HDL 36, LDL 121. Hgb 18. Creatinine 1.3. TSH and ALT normal.  ASSESSMENT AND PLAN:  1. Ischemic heart disease status post CABG in 1991 with a LIMA to the LAD and status post cardiac catheterization 2013 with drug-eluting stent to the mid circumflex. Last cardiac catheterization August 2015 shows widely patent stent and patent LIMA. He is asymptomatic.   Continue ASA 81 mg daily.  2. Essential hypertension. Fair control.  On low dose Toprol XL and lotensin.   3. Hyperlipidemia- intolerant of statins and Zetia. Recent LDL 121. Goal <70.   Continue dietary modification and take fish oil 4 grams daily. We discussed alternative therapy with PCSK 9 inhibitor but he is not interested in trying this.  Pl an: .follow up in 12 months.   Current medicines are reviewed at length with the patient today.  The patient does not have concerns regarding medicines.  The following changes have been made: stop plavix  Labs/ tests ordered today include:   No orders of the defined types were  placed in this encounter.  Signed, Flois Mctague [place] MD, [facility]   11/02/2017 3:36 PM    [facility]

## 2017-11-02 ENCOUNTER — Encounter: Payer: Self-pay | Admitting: Cardiology

## 2017-11-02 ENCOUNTER — Ambulatory Visit: Payer: Medicare Other | Admitting: Cardiology

## 2017-11-02 VITALS — BP 151/81 | HR 76 | Ht 74.0 in | Wt 186.0 lb

## 2017-11-02 DIAGNOSIS — I10 Essential (primary) hypertension: Secondary | ICD-10-CM | POA: Diagnosis not present

## 2017-11-02 DIAGNOSIS — I2510 Atherosclerotic heart disease of native coronary artery without angina pectoris: Secondary | ICD-10-CM | POA: Diagnosis not present

## 2017-11-02 DIAGNOSIS — E7800 Pure hypercholesterolemia, unspecified: Secondary | ICD-10-CM

## 2017-11-02 NOTE — Patient Instructions (Signed)
Continue your current therapy  I will see you in one year   

## 2017-12-28 ENCOUNTER — Other Ambulatory Visit: Payer: Self-pay | Admitting: Cardiology

## 2018-08-12 ENCOUNTER — Telehealth: Payer: Self-pay | Admitting: Cardiology

## 2018-08-12 ENCOUNTER — Emergency Department (HOSPITAL_COMMUNITY): Payer: Medicare Other

## 2018-08-12 ENCOUNTER — Encounter (HOSPITAL_COMMUNITY): Payer: Self-pay | Admitting: Emergency Medicine

## 2018-08-12 ENCOUNTER — Other Ambulatory Visit: Payer: Self-pay

## 2018-08-12 ENCOUNTER — Observation Stay (HOSPITAL_COMMUNITY)
Admission: EM | Admit: 2018-08-12 | Discharge: 2018-08-13 | Disposition: A | Discharge status: Home / Self Care | Payer: Medicare Other | Attending: Cardiology | Admitting: Cardiology

## 2018-08-12 DIAGNOSIS — I10 Essential (primary) hypertension: Secondary | ICD-10-CM | POA: Diagnosis not present

## 2018-08-12 DIAGNOSIS — Z7982 Long term (current) use of aspirin: Secondary | ICD-10-CM | POA: Diagnosis not present

## 2018-08-12 DIAGNOSIS — E782 Mixed hyperlipidemia: Secondary | ICD-10-CM | POA: Diagnosis not present

## 2018-08-12 DIAGNOSIS — M109 Gout, unspecified: Secondary | ICD-10-CM | POA: Diagnosis not present

## 2018-08-12 DIAGNOSIS — N179 Acute kidney failure, unspecified: Secondary | ICD-10-CM | POA: Diagnosis not present

## 2018-08-12 DIAGNOSIS — Z79899 Other long term (current) drug therapy: Secondary | ICD-10-CM | POA: Insufficient documentation

## 2018-08-12 DIAGNOSIS — I351 Nonrheumatic aortic (valve) insufficiency: Secondary | ICD-10-CM | POA: Diagnosis not present

## 2018-08-12 DIAGNOSIS — Z1159 Encounter for screening for other viral diseases: Secondary | ICD-10-CM | POA: Insufficient documentation

## 2018-08-12 DIAGNOSIS — R079 Chest pain, unspecified: Secondary | ICD-10-CM | POA: Diagnosis not present

## 2018-08-12 DIAGNOSIS — E785 Hyperlipidemia, unspecified: Secondary | ICD-10-CM | POA: Insufficient documentation

## 2018-08-12 DIAGNOSIS — Z951 Presence of aortocoronary bypass graft: Secondary | ICD-10-CM | POA: Diagnosis not present

## 2018-08-12 DIAGNOSIS — I2510 Atherosclerotic heart disease of native coronary artery without angina pectoris: Secondary | ICD-10-CM | POA: Insufficient documentation

## 2018-08-12 DIAGNOSIS — I200 Unstable angina: Secondary | ICD-10-CM | POA: Diagnosis not present

## 2018-08-12 DIAGNOSIS — Z955 Presence of coronary angioplasty implant and graft: Secondary | ICD-10-CM | POA: Insufficient documentation

## 2018-08-12 LAB — CBC WITH DIFFERENTIAL/PLATELET
Abs Immature Granulocytes: 0.03 10*3/uL (ref 0.00–0.07)
Basophils Absolute: 0.1 10*3/uL (ref 0.0–0.1)
Basophils Relative: 1 %
Eosinophils Absolute: 0.1 10*3/uL (ref 0.0–0.5)
Eosinophils Relative: 1 %
HCT: 48.8 % (ref 39.0–52.0)
Hemoglobin: 17 g/dL (ref 13.0–17.0)
Immature Granulocytes: 0 %
Lymphocytes Relative: 15 %
Lymphs Abs: 1.3 10*3/uL (ref 0.7–4.0)
MCH: 31.5 pg (ref 26.0–34.0)
MCHC: 34.8 g/dL (ref 30.0–36.0)
MCV: 90.5 fL (ref 80.0–100.0)
Monocytes Absolute: 0.6 10*3/uL (ref 0.1–1.0)
Monocytes Relative: 7 %
Neutro Abs: 6.6 10*3/uL (ref 1.7–7.7)
Neutrophils Relative %: 76 %
Platelets: 142 10*3/uL — ABNORMAL LOW (ref 150–400)
RBC: 5.39 MIL/uL (ref 4.22–5.81)
RDW: 13.5 % (ref 11.5–15.5)
WBC: 8.7 10*3/uL (ref 4.0–10.5)
nRBC: 0 % (ref 0.0–0.2)

## 2018-08-12 LAB — COMPREHENSIVE METABOLIC PANEL
ALT: 36 U/L (ref 0–44)
AST: 34 U/L (ref 15–41)
Albumin: 4.1 g/dL (ref 3.5–5.0)
Alkaline Phosphatase: 87 U/L (ref 38–126)
Anion gap: 12 (ref 5–15)
BUN: 12 mg/dL (ref 8–23)
CO2: 23 mmol/L (ref 22–32)
Calcium: 9.4 mg/dL (ref 8.9–10.3)
Chloride: 103 mmol/L (ref 98–111)
Creatinine, Ser: 1.53 mg/dL — ABNORMAL HIGH (ref 0.61–1.24)
GFR calc Af Amer: 49 mL/min — ABNORMAL LOW (ref >60)
GFR calc non Af Amer: 42 mL/min — ABNORMAL LOW (ref >60)
Glucose, Bld: 99 mg/dL (ref 70–99)
Potassium: 4.1 mmol/L (ref 3.5–5.1)
Sodium: 138 mmol/L (ref 135–145)
Total Bilirubin: 0.7 mg/dL (ref 0.3–1.2)
Total Protein: 6.7 g/dL (ref 6.5–8.1)

## 2018-08-12 LAB — CREATININE, SERUM
Creatinine, Ser: 1.37 mg/dL — ABNORMAL HIGH (ref 0.61–1.24)
GFR calc Af Amer: 56 mL/min — ABNORMAL LOW (ref >60)
GFR calc non Af Amer: 48 mL/min — ABNORMAL LOW (ref >60)

## 2018-08-12 LAB — BRAIN NATRIURETIC PEPTIDE: B Natriuretic Peptide: 41.4 pg/mL (ref 0.0–100.0)

## 2018-08-12 LAB — TROPONIN I
Troponin I: <0.03 ng/mL (ref <0.03)
Troponin I: <0.03 ng/mL (ref <0.03)

## 2018-08-12 LAB — SARS CORONAVIRUS 2 BY RT PCR (HOSPITAL ORDER, PERFORMED IN ~~LOC~~ HOSPITAL LAB): SARS Coronavirus 2: NEGATIVE

## 2018-08-12 MED ORDER — ALLOPURINOL 100 MG PO TABS
100.0000 mg | ORAL_TABLET | Freq: Two times a day (BID) | ORAL | Status: DC
Start: 2018-08-13 — End: 2018-08-13
  Administered 2018-08-13 (×2): 100 mg via ORAL
  Filled 2018-08-12 (×2): qty 1

## 2018-08-12 MED ORDER — HEPARIN SODIUM (PORCINE) 5000 UNIT/ML IJ SOLN
5000.0000 [IU] | Freq: Three times a day (TID) | INTRAMUSCULAR | Status: DC
  Administered 2018-08-12 – 2018-08-13 (×3): 5000 [IU] via SUBCUTANEOUS
  Filled 2018-08-12 (×3): qty 1

## 2018-08-12 MED ORDER — ASPIRIN EC 81 MG PO TBEC
81.0000 mg | DELAYED_RELEASE_TABLET | Freq: Every day | ORAL | Status: DC
Start: 2018-08-13 — End: 2018-08-13
  Administered 2018-08-13: 81 mg via ORAL
  Filled 2018-08-12: qty 1

## 2018-08-12 MED ORDER — NITROGLYCERIN 0.4 MG SL SUBL: 0.4000 mg | SUBLINGUAL_TABLET | SUBLINGUAL | Status: DC | PRN

## 2018-08-12 MED ORDER — OMEGA-3-ACID ETHYL ESTERS 1 G PO CAPS
1.0000 g | ORAL_CAPSULE | Freq: Every day | ORAL | Status: DC
  Administered 2018-08-13: 1 g via ORAL
  Filled 2018-08-12: qty 1

## 2018-08-12 MED ORDER — NITROGLYCERIN 0.4 MG SL SUBL
0.4000 mg | SUBLINGUAL_TABLET | SUBLINGUAL | Status: DC | PRN
  Administered 2018-08-12: 0.4 mg via SUBLINGUAL
  Filled 2018-08-12: qty 1

## 2018-08-12 MED ORDER — AMLODIPINE BESYLATE 2.5 MG PO TABS
2.5000 mg | ORAL_TABLET | Freq: Every day | ORAL | Status: DC
  Administered 2018-08-13: 2.5 mg via ORAL
  Filled 2018-08-12 (×2): qty 1

## 2018-08-12 MED ORDER — ACETAMINOPHEN 325 MG PO TABS: 650.0000 mg | ORAL_TABLET | ORAL | Status: DC | PRN

## 2018-08-12 MED ORDER — ONDANSETRON HCL 4 MG/2ML IJ SOLN: 4.0000 mg | Freq: Four times a day (QID) | INTRAMUSCULAR | Status: DC | PRN

## 2018-08-12 MED ORDER — METOPROLOL SUCCINATE ER 25 MG PO TB24
12.5000 mg | ORAL_TABLET | Freq: Every day | ORAL | Status: DC
Start: 2018-08-13 — End: 2018-08-13
  Administered 2018-08-13: 12.5 mg via ORAL
  Filled 2018-08-12: qty 1

## 2018-08-12 NOTE — ED Notes (Signed)
ED TO INPATIENT HANDOFF REPORT  ED Nurse Name and Phone #: Carmie KannerCallie, RN [phone]  S Name/Age/Gender Devon SprangWallace Tyron Haacke 78 y.o. male Room/Bed: 032C/032C  Code Status   Code Status: Prior  Home/SNF/Other Home Patient oriented to: self, place, time and situation Is this baseline? Yes   Triage Complete: Triage complete  Chief Complaint cp  Triage Note Pt c/o left sided chest pain that started intermittently last night, and pain today after doing yard work and taking a shower. Pt took 1 of his own NTG, chest pain has subsided. EMS gave 324mg  ASA. Denies shortness of breath.    Allergies Allergies  Allergen Reactions  . Metoprolol Other (See Comments)    Chest pressure  . Statins Other (See Comments)    Myalgias  . Zetia [Ezetimibe] Other (See Comments)    Myalgias    Level of Care/Admitting Diagnosis ED Disposition    ED Disposition Condition Comment   Admit  [facility] [[id number]]  Level of Care: Telemetry Cardiac [103]  Covid Evaluation: Screening Protocol (No Symptoms)  Diagnosis: Chest pain with moderate risk for cardiac etiology [[id number]][[id number]]  Admitting Physician: Lars MassonELSON, KATARINA H [[id number]]  Attending Physician: Lars MassonNELSON, KATARINA H [[id number]]  PT Class (Do Not Modify): Observation [104]  PT Acc Code (Do Not Modify): Observation [10022]       B Medical/Surgery History Past Medical History:  Diagnosis Date  . Bilateral plantar fasciitis   . Coronary artery disease    a. s/p CABG 1991 with LIMA to LAD. b. s/p DES to mid LCX 06/2011. c. Cath 10/2013: severe disease in LAD with competitive flow distal to this from LIMA graft. EF 55-60%. Continued med rx.  . H/O: gout   . Hyperlipidemia    statin intolerant; intolerant to zetia  . Hypertension   . Thrombocytopenia (HCC)    Past Surgical History:  Procedure Laterality Date  . CARDIAC CATHETERIZATION  April 2013   DES to mid LCX; EF of 50%.   . CORONARY ARTERY BYPASS GRAFT  1991   severe 90% ostial LAD lesion with LIMA to LAD  . LEFT HEART CATHETERIZATION WITH CORONARY ANGIOGRAM N/A 10/21/2013   Procedure: LEFT HEART CATHETERIZATION WITH CORONARY ANGIOGRAM;  Surgeon: Peter M SwazilandJordan, MD;  [facility];  Service: Cardiovascular;  Laterality: N/A;  . LEFT HEART CATHETERIZATION WITH CORONARY/GRAFT ANGIOGRAM N/A 07/03/2011   Procedure: LEFT HEART CATHETERIZATION WITH Isabel CapriceORONARY/GRAFT ANGIOGRAM;  Surgeon: Herby Abrahamhomas D Stuckey, MD;  [facility];  Service: Cardiovascular;  Laterality: N/A;     A IV Location/Drains/Wounds Patient Lines/Drains/Airways Status   Active Line/Drains/Airways    None          Intake/Output Last 24 hours No intake or output data in the 24 hours ending 08/12/18 1937  Labs/Imaging Results for orders placed or performed during the hospital encounter of 08/12/18 (from the past 48 hour(s))  CBC with Differential/Platelet     Status: Abnormal   Collection Time: 08/12/18  3:49 PM  Result Value Ref Range   WBC 8.7 4.0 - 10.5 K/uL   RBC 5.39 4.22 - 5.81 MIL/uL   Hemoglobin 17.0 13.0 - 17.0 g/dL   HCT 62.948.8 52.839.0 - 41.352.0 %   MCV 90.5 80.0 - 100.0 fL   MCH 31.5 26.0 - 34.0 pg   MCHC 34.8 30.0 - 36.0 g/dL   RDW 24.413.5 01.011.5 - 27.215.5 %   Platelets 142 (L) 150 - 400 K/uL   nRBC 0.0 0.0 - 0.2 %  Neutrophils Relative % 76 %   Neutro Abs 6.6 1.7 - 7.7 K/uL   Lymphocytes Relative 15 %   Lymphs Abs 1.3 0.7 - 4.0 K/uL   Monocytes Relative 7 %   Monocytes Absolute 0.6 0.1 - 1.0 K/uL   Eosinophils Relative 1 %   Eosinophils Absolute 0.1 0.0 - 0.5 K/uL   Basophils Relative 1 %   Basophils Absolute 0.1 0.0 - 0.1 K/uL   Immature Granulocytes 0 %   Abs Immature Granulocytes 0.03 0.00 - 0.07 K/uL    Comment: Performed at [facility] [address], [place], [place] [address]  Comprehensive metabolic panel     Status: Abnormal   Collection Time: 08/12/18  3:49 PM  Result Value Ref Range   Sodium 138 135 - 145 mmol/L   Potassium 4.1 3.5 -  5.1 mmol/L   Chloride 103 98 - 111 mmol/L   CO2 23 22 - 32 mmol/L   Glucose, Bld 99 70 - 99 mg/dL   BUN 12 8 - 23 mg/dL   Creatinine, Ser 1.53 (H) 0.61 - 1.24 mg/dL   Calcium 9.4 8.9 - 10.3 mg/dL   Total Protein 6.7 6.5 - 8.1 g/dL   Albumin 4.1 3.5 - 5.0 g/dL   AST 34 15 - 41 U/L   ALT 36 0 - 44 U/L   Alkaline Phosphatase 87 38 - 126 U/L   Total Bilirubin 0.7 0.3 - 1.2 mg/dL   GFR calc non Af Amer 42 (L) >60 mL/min   GFR calc Af Amer 49 (L) >60 mL/min   Anion gap 12 5 - 15    Comment: Performed at [facility] [address], [place], [place] [address]  Troponin I - ONCE - STAT     Status: None   Collection Time: 08/12/18  3:49 PM  Result Value Ref Range   Troponin I <0.03 <0.03 ng/mL    Comment: Performed at [facility], [place] [address], [place], [place] [address]  Brain natriuretic peptide     Status: None   Collection Time: 08/12/18  3:49 PM  Result Value Ref Range   B Natriuretic Peptide 41.4 0.0 - 100.0 pg/mL    Comment: Performed at [facility] [address], [place], [place] [address]   Dg Chest Port 1 View  Result Date: 08/12/2018 CLINICAL DATA:  Chest pain. EXAM: PORTABLE CHEST 1 VIEW COMPARISON:  Radiographs of October 20, 2013. FINDINGS: Stable cardiomediastinal silhouette. No pneumothorax or pleural effusion is noted. Sternotomy wires are noted. Both lungs are clear. The visualized skeletal structures are unremarkable. IMPRESSION: No active disease. Electronically Signed   By: Marijo Conception M.D.   On: 08/12/2018 16:21    Pending Labs Unresulted Labs (From admission, onward)    Start     Ordered   08/12/18 1540  SARS Coronavirus 2 (CEPHEID - Performed in [place] hospital lab), [place]  (Asymptomatic Patients Labs)  Once,   R    Question:  Rule Out  Answer:  Yes   08/12/18 1540   Signed and Held  Creatinine, serum  (heparin)  Once,   R    Comments:  Baseline for heparin therapy IF NOT ALREADY DRAWN.    Signed and Held   Signed and Held   Troponin I - Now Then Q6H  Now then every 6 hours,   STAT     Signed and Held   Signed and Held  Lipid panel  Tomorrow morning,   R     Signed and Held   Signed and Held  Basic metabolic panel  Tomorrow morning,   R     Signed and Held          Vitals/Pain Today's Vitals   08/12/18 1553 08/12/18 1800 08/12/18 1805 08/12/18 1930  BP:  (!) 155/89  (!) 145/88  Pulse:  68  65  Resp:  20  10  Temp:      TempSrc:      SpO2:  97%  97%  PainSc: 2   0-No pain     Isolation Precautions No active isolations  Medications Medications  nitroGLYCERIN (NITROSTAT) SL tablet 0.4 mg (0.4 mg Sublingual Given 08/12/18 1553)  amLODipine (NORVASC) tablet 2.5 mg (has no administration in time range)    Mobility walks Low fall risk   Focused Assessments Cardiac Assessment Handoff:  Cardiac Rhythm: Normal sinus rhythm Lab Results  Component Value Date   CKTOTAL 38 07/03/2011   CKMB 1.5 07/03/2011   TROPONINI <0.03 08/12/2018   No results found for: DDIMER Does the Patient currently have chest pain? No     R Recommendations: See Admitting Provider Note  Report given to:   Additional Notes:  Denies chest pain at this time. Hx CABG, MI

## 2018-08-12 NOTE — Telephone Encounter (Signed)
New message    Patient's son states that he is having issues with the bottom of his heart.Please call to discuss. Patient took a nitroglycerin.  Attempted to call triage no one was available.

## 2018-08-12 NOTE — ED Notes (Signed)
Attempted report x1. 

## 2018-08-12 NOTE — Telephone Encounter (Signed)
Patient currently in ED.

## 2018-08-12 NOTE — Telephone Encounter (Signed)
Left message to call back  

## 2018-08-12 NOTE — ED Triage Notes (Signed)
Pt c/o left sided chest pain that started intermittently last night, and pain today after doing yard work and taking a shower. Pt took 1 of his own NTG, chest pain has subsided. EMS gave 324mg  ASA. Denies shortness of breath.

## 2018-08-12 NOTE — H&P (Signed)
Cardiology Admission History and Physical:   Patient ID: Devon [name] MRN: [id number]; DOB: 10/17/1940   Admission date: 08/12/2018  Primary Care Provider: Geoffry ParadiseAronson, Richard, MD Primary Cardiologist: Dr. SwazilandJordan   Chief Complaint:  CP  Patient Profile:   Devon [name] is a 78 y.o. male with hx of CAD s/p remote CABG and then PCI, HTN and HLD ( statin intolerance) presents for chest pain.   He had remote CABG in 1991 with a LIMA to the LAD. In 2013 he developed a decrease in exercise tolerance and new anterior T wave changes on his EKG and underwent cardiac catheterization with subsequent DES to the mid circumflex. His ejection fraction was 50%. He has a history of dyslipidemia but is intolerant to statins and also to ezetimibe. In August 2015 he had chest discomfort and was admitted and had cardiac catheterization which showed a widely patent stent to the circumflex and a widely patent LIMA to the LAD and no new areas of stenosis.  Last seen by Dr. SwazilandJordan 11/02/17. Not interested in PCSK9 inhibitor.   History of Present Illness:   Devon [name] presented for evaluation of chest pain.  At baseline, patient is very active.  Patient had chest pain few days ago after raking leaves for few hours.  Associated with shortness of breath.  It relieved with sublingual nitroglycerin x1 and rest.  He also helped his budy with heavy lifting in his shop.  Did not felt well for past few days.  This morning after taking shower he noted substernal chest discomfort with shortness of breath.  He took his blood pressure and it was elevated.  His symptoms resolved with sublingual nitroglycerin x1.  He called his daughter-in-law who is a Advice workerphysician assistant and recommended further evaluation.  Patient denies palpitation, dizziness, orthopnea, PND, syncope or lower extremity edema.  Compliant with his medication.  He required to take his sublingual nitroglycerin after long time.  This might be  expired.  In ER, EKG shows sinus rhythm with PVCs.  Creatinine 1.53.  Troponin negative.  BNP normal.  Pending COVID.  Cardiology is asked for further evaluation.  Past Medical History:  Diagnosis Date  . Bilateral plantar fasciitis   . Coronary artery disease    a. s/p CABG 1991 with LIMA to LAD. b. s/p DES to mid LCX 06/2011. c. Cath 10/2013: severe disease in LAD with competitive flow distal to this from LIMA graft. EF 55-60%. Continued med rx.  . H/O: gout   . Hyperlipidemia    statin intolerant; intolerant to zetia  . Hypertension   . Thrombocytopenia (HCC)     Past Surgical History:  Procedure Laterality Date  . CARDIAC CATHETERIZATION  April 2013   DES to mid LCX; EF of 50%.   . CORONARY ARTERY BYPASS GRAFT  1991   severe 90% ostial LAD lesion with LIMA to LAD  . LEFT HEART CATHETERIZATION WITH CORONARY ANGIOGRAM N/A 10/21/2013   Procedure: LEFT HEART CATHETERIZATION WITH CORONARY ANGIOGRAM;  Surgeon: Peter M SwazilandJordan, MD;  [facility];  Service: Cardiovascular;  Laterality: N/A;  . LEFT HEART CATHETERIZATION WITH CORONARY/GRAFT ANGIOGRAM N/A 07/03/2011   Procedure: LEFT HEART CATHETERIZATION WITH Isabel CapriceORONARY/GRAFT ANGIOGRAM;  Surgeon: Herby Abrahamhomas D Stuckey, MD;  [facility];  Service: Cardiovascular;  Laterality: N/A;     Medications Prior to Admission: Prior to Admission medications   Medication Sig Start Date End Date Taking? Authorizing Provider  allopurinol (ZYLOPRIM) 100 MG tablet Take 100 mg by mouth 2 (  two) times daily.     [provider]  aspirin EC 81 MG tablet Take 81 mg by mouth daily.    [provider]  benazepril (LOTENSIN) 40 MG tablet Take 1 tablet (40 mg total) by mouth daily. 03/11/15   Cassell ClementBrackbill, Thomas, MD  desonide (DESOWEN) 0.05 % cream Apply 1 application topically daily as needed (for spots).    [provider]  fish oil-omega-3 fatty acids 1000 MG capsule Take 1 g by mouth daily.    [provider]   metoprolol succinate (TOPROL-XL) 25 MG 24 hr tablet TAKE 1/2 TABLET(12.5 MG) BY MOUTH DAILY 12/31/17   SwazilandJordan, Peter M, MD  nitroGLYCERIN (NITROSTAT) 0.4 MG SL tablet Place 0.4 mg under the tongue every 5 (five) minutes as needed for chest pain.    [provider]     Allergies:    Allergies  Allergen Reactions  . Metoprolol Other (See Comments)    Chest pressure  . Statins Other (See Comments)    Myalgias  . Zetia [Ezetimibe] Other (See Comments)    Myalgias    Social History:   Social History   Socioeconomic History  . Marital status: Married    Spouse name: Not on file  . Number of children: Not on file  . Years of education: Not on file  . Highest education level: Not on file  Occupational History  . Not on file  Social Needs  . Financial resource strain: Not on file  . Food insecurity:    Worry: Not on file    Inability: Not on file  . Transportation needs:    Medical: Not on file    Non-medical: Not on file  Tobacco Use  . Smoking status: Never Smoker  . Smokeless tobacco: Never Used  Substance and Sexual Activity  . Alcohol use: Yes    Comment: Occassional Use  . Drug use: No  . Sexual activity: Not Currently  Lifestyle  . Physical activity:    Days per week: Not on file    Minutes per session: Not on file  . Stress: Not on file  Relationships  . Social connections:    Talks on phone: Not on file    Gets together: Not on file    Attends religious service: Not on file    Active member of club or organization: Not on file    Attends meetings of clubs or organizations: Not on file    Relationship status: Not on file  . Intimate partner violence:    Fear of current or ex partner: Not on file    Emotionally abused: Not on file    Physically abused: Not on file    Forced sexual activity: Not on file  Other Topics Concern  . Not on file  Social History Narrative  . Not on file    Family History:   The patient's family history includes  Alzheimer's disease in his mother; Healthy in his sister.   ROS:  Please see the history of present illness.  All other ROS reviewed and negative.     Physical Exam/Data:   Vitals:   08/12/18 1800 08/12/18 1930 08/12/18 2015 08/12/18 2127  BP: (!) 155/89 (!) 145/88 (!) 149/80 (!) 170/81  Pulse: 68 65 70 72  Resp: 20 10 10 18   Temp:    98.3 F (36.8 C)  TempSrc:    Oral  SpO2: 97% 97% 95% 98%  Weight:    83.6 kg  Height:  6\' 2"  (1.88 m)    Intake/Output Summary (Last 24 hours) at 08/12/2018 2155 Last data filed at 08/12/2018 2125 Gross per 24 hour  Intake -  Output 200 ml  Net -200 ml   Last 3 Weights 08/12/2018 11/02/2017 10/17/2016  Weight (lbs) 184 lb 4.9 oz 186 lb 185 lb 3.2 oz  Weight (kg) 83.6 kg 84.369 kg 84.006 kg     Body mass index is 23.66 kg/m.  General:  Well nourished, well developed, in no acute distress HEENT: normal Lymph: no adenopathy Neck: no JVD Endocrine:  No thryomegaly Vascular: No carotid bruits; FA pulses 2+ bilaterally without bruits  Cardiac:  normal S1, S2; RRR; no murmur  Lungs:  clear to auscultation bilaterally, no wheezing, rhonchi or rales  Abd: soft, nontender, no hepatomegaly  Ext: no edema Musculoskeletal:  No deformities, BUE and BLE strength normal and equal Skin: warm and dry  Neuro:  CNs 2-12 intact, no focal abnormalities noted Psych:  Normal affect   EKG:  The ECG that was done today  was personally reviewed and demonstrates SR at rate of 72 bpm and PVCs  Relevant CV Studies:  Cath 10/2013 Coronary angiography: Coronary dominance: right  Left mainstem: Normal.   Left anterior descending (LAD): There is severe disease in the mid LAD with competitive flow distal to this from the LIMA graft. The first and second diagonal branches are normal.   Left circumflex (LCx): Normal. The stent in the mid vessel is widely patent.  Right coronary artery (RCA): Mild 20% disease in the mid vessel.   LIMA to the LAD is widely  patent.   Left ventriculography: Left ventricular systolic function is normal, LVEF is estimated at 55-65%, there is no significant mitral regurgitation   Final Conclusions:   1. Single vessel obstructive CAD 2. Patent LIMA to the LAD. 3. Normal LV function.  Recommendations: Continue prior medical therapy. Patent is stable for DC today.  Laboratory Data:  Chemistry Recent Labs  Lab 08/12/18 1549  NA 138  K 4.1  CL 103  CO2 23  GLUCOSE 99  BUN 12  CREATININE 1.53*  CALCIUM 9.4  GFRNONAA 42*  GFRAA 49*  ANIONGAP 12    Recent Labs  Lab 08/12/18 1549  PROT 6.7  ALBUMIN 4.1  AST 34  ALT 36  ALKPHOS 87  BILITOT 0.7   Hematology Recent Labs  Lab 08/12/18 1549  WBC 8.7  RBC 5.39  HGB 17.0  HCT 48.8  MCV 90.5  MCH 31.5  MCHC 34.8  RDW 13.5  PLT 142*   Cardiac Enzymes Recent Labs  Lab 08/12/18 1549  TROPONINI <0.03   No results for input(s): TROPIPOC in the last 168 hours.  BNP Recent Labs  Lab 08/12/18 1549  BNP 41.4    Radiology/Studies:  Dg Chest Port 1 View  Result Date: 08/12/2018 CLINICAL DATA:  Chest pain. EXAM: PORTABLE CHEST 1 VIEW COMPARISON:  Radiographs of October 20, 2013. FINDINGS: Stable cardiomediastinal silhouette. No pneumothorax or pleural effusion is noted. Sternotomy wires are noted. Both lungs are clear. The visualized skeletal structures are unremarkable. IMPRESSION: No active disease. Electronically Signed   By: Marijo Conception M.D.   On: 08/12/2018 16:21    Assessment and Plan:   1. Chest pain/Fatigue/palpitations -His symptoms started after heavy exertional activity few days ago.  Chest pain and dyspnea improved with rest and sublingual nitroglycerin.  Did not felt well for few days.  This morning he had a recurrent episode after taking  shower which improved with another nitroglycerin.  Currently asymptomatic. Admits to increased fatigue and DOE lately. -Troponin negative.  BNP normal.  EKG without acute ischemic changes  but has PVCs. -Continue aspirin and metoprolol. - we will obtain an echocardiogram and plan for a stress test in the morning.  2.  Hypertension -Elevated.  Hold benazepril given AKI.  Continue Toprol-XL.  Will add amlodipine 2.5 mg po daily.  3.  AKI -Likely due to dehydration.  Hold ACE.  4.  Hyperlipidemia -Statin intolerance.  Previously declined PCSK9 inhibitor evaluation. We will refer to the lipid clinic again. - Check lipid panel  Severity of Illness: The appropriate patient status for this patient is OBSERVATION. Observation status is judged to be reasonable and necessary in order to provide the required intensity of service to ensure the patient's safety. The patient's presenting symptoms, physical exam findings, and initial radiographic and laboratory data in the context of their medical condition is felt to place them at decreased risk for further clinical deterioration. Furthermore, it is anticipated that the patient will be medically stable for discharge from the hospital within 2 midnights of admission. The following factors support the patient status of observation.   " The patient's presenting symptoms include chest pain. " The physical exam findings include: as above. " The initial radiographic and laboratory data are: as above.  For questions or updates, please contact [facility] Please consult [email or web address] for contact info under   Signed, Tobias AlexanderKatarina Gaddiel Cullens, MD  08/12/2018 9:55 PM

## 2018-08-12 NOTE — ED Provider Notes (Signed)
[facility] Provider Note   CSN: [id number] Arrival date & time: 08/12/18  1506    History   Chief Complaint Chief Complaint  Patient presents with  . Chest Pain    HPI Devon [name] is a 78 y.o. male.     HPI Patient with prior history of CABG presents with episodic chest discomfort for the last few days.  Seems to be exacerbated by exertion.  States that he took a shower this morning and in the shower had left-sided chest tightness which radiated to his neck.  He got out of the shower and took aspirin and 1 nitroglycerin.  States that the chest tightness has improved though has had some discomfort in his neck.  Has had intermittent shortness of breath but denies any currently.  No fever or chills.  No new lower extremity swelling or pain. Past Medical History:  Diagnosis Date  . Bilateral plantar fasciitis   . Coronary artery disease    a. s/p CABG 1991 with LIMA to LAD. b. s/p DES to mid LCX 06/2011. c. Cath 10/2013: severe disease in LAD with competitive flow distal to this from LIMA graft. EF 55-60%. Continued med rx.  . H/O: gout   . Hyperlipidemia    statin intolerant; intolerant to zetia  . Hypertension   . Thrombocytopenia [facility](HCC)     Patient Active Problem List   Diagnosis Date Noted  . Thrombocytopenia (HCC) 10/21/2013  . Chest pain 10/20/2013  . HLD (hyperlipidemia) 07/11/2011  . CAD (coronary artery disease) 07/04/2011  . HTN (hypertension) 07/04/2011  . Bilateral plantar fasciitis   . H/O: gout     Past Surgical History:  Procedure Laterality Date  . CARDIAC CATHETERIZATION  April 2013   DES to mid LCX; EF of 50%.   . CORONARY ARTERY BYPASS GRAFT  1991   severe 90% ostial LAD lesion with LIMA to LAD  . LEFT HEART CATHETERIZATION WITH CORONARY ANGIOGRAM N/A 10/21/2013   Procedure: LEFT HEART CATHETERIZATION WITH CORONARY ANGIOGRAM;  Surgeon: Peter M SwazilandJordan, MD;  [facility];  Service: Cardiovascular;   Laterality: N/A;  . LEFT HEART CATHETERIZATION WITH CORONARY/GRAFT ANGIOGRAM N/A 07/03/2011   Procedure: LEFT HEART CATHETERIZATION WITH Isabel CapriceORONARY/GRAFT ANGIOGRAM;  Surgeon: Herby Abrahamhomas D Stuckey, MD;  [facility];  Service: Cardiovascular;  Laterality: N/A;        Home Medications    Prior to Admission medications   Medication Sig Start Date End Date Taking? Authorizing Provider  allopurinol (ZYLOPRIM) 100 MG tablet Take 100 mg by mouth 2 (two) times daily.     [provider]  aspirin EC 81 MG tablet Take 81 mg by mouth daily.    [provider]  benazepril (LOTENSIN) 40 MG tablet Take 1 tablet (40 mg total) by mouth daily. 03/11/15   Cassell ClementBrackbill, Thomas, MD  desonide (DESOWEN) 0.05 % cream Apply 1 application topically daily as needed (for spots).    [provider]  fish oil-omega-3 fatty acids 1000 MG capsule Take 1 g by mouth daily.    [provider]  metoprolol succinate (TOPROL-XL) 25 MG 24 hr tablet TAKE 1/2 TABLET(12.5 MG) BY MOUTH DAILY 12/31/17   SwazilandJordan, Peter M, MD  nitroGLYCERIN (NITROSTAT) 0.4 MG SL tablet Place 0.4 mg under the tongue every 5 (five) minutes as needed for chest pain.    [provider]    Family History Family History  Problem Relation Age of Onset  . Alzheimer's disease Mother   .  Healthy Sister     Social History Social History   Tobacco Use  . Smoking status: Never Smoker  . Smokeless tobacco: Never Used  Substance Use Topics  . Alcohol use: Yes    Comment: Occassional Use  . Drug use: No     Allergies   Metoprolol; Statins; and Zetia [ezetimibe]   Review of Systems Review of Systems  Constitutional: Negative for chills and fever.  HENT: Negative for sore throat and trouble swallowing.   Eyes: Negative for visual disturbance.  Respiratory: Positive for chest tightness and shortness of breath. Negative for cough.   Cardiovascular: Positive for chest pain. Negative for palpitations and leg  swelling.  Gastrointestinal: Negative for abdominal pain, constipation, diarrhea, nausea and vomiting.  Genitourinary: Negative for dysuria, flank pain and frequency.  Musculoskeletal: Negative for back pain and myalgias.  Skin: Negative for rash and wound.  Neurological: Negative for dizziness, weakness, light-headedness, numbness and headaches.  All other systems reviewed and are negative.    Physical Exam Updated Vital Signs BP (!) 155/89   Pulse 68   Temp 97.6 F (36.4 C) (Oral)   Resp 20   SpO2 97%   Physical Exam Vitals signs and nursing note reviewed.  Constitutional:      General: He is not in acute distress.    Appearance: Normal appearance. He is well-developed. He is not ill-appearing.  HENT:     Head: Normocephalic and atraumatic.     Nose: Nose normal.     Mouth/Throat:     Mouth: Mucous membranes are moist.  Eyes:     Pupils: Pupils are equal, round, and reactive to light.  Neck:     Musculoskeletal: Normal range of motion and neck supple. No neck rigidity or muscular tenderness.  Cardiovascular:     Rate and Rhythm: Normal rate and regular rhythm.     Heart sounds: Murmur present. No friction rub. No gallop.   Pulmonary:     Effort: Pulmonary effort is normal. No respiratory distress.     Breath sounds: Normal breath sounds. No stridor. No wheezing, rhonchi or rales.  Chest:     Chest wall: No tenderness.  Abdominal:     General: Bowel sounds are normal.     Palpations: Abdomen is soft.     Tenderness: There is no abdominal tenderness. There is no right CVA tenderness, left CVA tenderness, guarding or rebound.  Musculoskeletal: Normal range of motion.        General: No swelling, tenderness, deformity or signs of injury.     Right lower leg: No edema.     Left lower leg: No edema.  Lymphadenopathy:     Cervical: No cervical adenopathy.  Skin:    General: Skin is warm and dry.     Capillary Refill: Capillary refill takes less than 2 seconds.      Findings: No erythema or rash.  Neurological:     General: No focal deficit present.     Mental Status: He is alert and oriented to person, place, and time.  Psychiatric:        Behavior: Behavior normal.      ED Treatments / Results  Labs (all labs ordered are listed, but only abnormal results are displayed) Labs Reviewed  CBC WITH DIFFERENTIAL/PLATELET - Abnormal; Notable for the following components:      Result Value   Platelets 142 (*)    All other components within normal limits  COMPREHENSIVE METABOLIC PANEL - Abnormal; Notable for the  following components:   Creatinine, Ser 1.53 (*)    GFR calc non Af Amer 42 (*)    GFR calc Af Amer 49 (*)    All other components within normal limits  SARS CORONAVIRUS 2 (HOSPITAL ORDER, PERFORMED IN [place] [facility])  TROPONIN I  BRAIN NATRIURETIC PEPTIDE    EKG EKG Interpretation  Date/Time:  Monday August 12 2018 15:18:41 EDT Ventricular Rate:  73 PR Interval:    QRS Duration: 101 QT Interval:  414 QTC Calculation: 457 R Axis:   27 Text Interpretation:  Sinus rhythm Multiform ventricular premature complexes Nonspecific T abnrm, anterolateral leads Confirmed by Loren RacerYelverton, Zalan Shidler ([id number]) on 08/12/2018 4:04:37 PM   Radiology Dg Chest Port 1 View  Result Date: 08/12/2018 CLINICAL DATA:  Chest pain. EXAM: PORTABLE CHEST 1 VIEW COMPARISON:  Radiographs of October 20, 2013. FINDINGS: Stable cardiomediastinal silhouette. No pneumothorax or pleural effusion is noted. Sternotomy wires are noted. Both lungs are clear. The visualized skeletal structures are unremarkable. IMPRESSION: No active disease. Electronically Signed   By: Lupita RaiderJames  Green Jr M.D.   On: 08/12/2018 16:21    Procedures Procedures (including critical care time)  Medications Ordered in ED Medications  nitroGLYCERIN (NITROSTAT) SL tablet 0.4 mg (0.4 mg Sublingual Given 08/12/18 1553)     Initial Impression / Assessment and Plan / ED Course  I have reviewed the  triage vital signs and the nursing notes.  Pertinent labs & imaging results that were available during my care of the patient were reviewed by me and considered in my medical decision making (see chart for details).       Patient is now pain-free.  EKG without concerning findings.  Initial troponin is normal.  Concern for unstable angina.  Discussed with cardiology who will see patient in the emergency department and plan on admission.   Final Clinical Impressions(s) / ED Diagnoses   Final diagnoses:  Unstable angina [facility](HCC)    ED Discharge Orders    None       Loren RacerYelverton, Evanell Redlich, MD 08/12/18 [id number]

## 2018-08-13 ENCOUNTER — Other Ambulatory Visit: Payer: Self-pay | Admitting: Physician Assistant

## 2018-08-13 ENCOUNTER — Observation Stay (HOSPITAL_BASED_OUTPATIENT_CLINIC_OR_DEPARTMENT_OTHER): Payer: Medicare Other

## 2018-08-13 ENCOUNTER — Other Ambulatory Visit: Payer: Self-pay

## 2018-08-13 DIAGNOSIS — I249 Acute ischemic heart disease, unspecified: Secondary | ICD-10-CM | POA: Diagnosis not present

## 2018-08-13 DIAGNOSIS — R079 Chest pain, unspecified: Secondary | ICD-10-CM | POA: Diagnosis not present

## 2018-08-13 DIAGNOSIS — I2510 Atherosclerotic heart disease of native coronary artery without angina pectoris: Secondary | ICD-10-CM

## 2018-08-13 DIAGNOSIS — I10 Essential (primary) hypertension: Secondary | ICD-10-CM | POA: Diagnosis not present

## 2018-08-13 DIAGNOSIS — Z951 Presence of aortocoronary bypass graft: Secondary | ICD-10-CM | POA: Diagnosis not present

## 2018-08-13 DIAGNOSIS — I340 Nonrheumatic mitral (valve) insufficiency: Secondary | ICD-10-CM

## 2018-08-13 LAB — NM MYOCAR MULTI W/SPECT W/WALL MOTION / EF
Estimated workload: 1 METS
Exercise duration (min): 5 min
Exercise duration (sec): 34 s
MPHR: 140 {beats}/min
Peak HR: 79 {beats}/min
Percent HR: 56 %
Rest HR: 67 {beats}/min

## 2018-08-13 LAB — BASIC METABOLIC PANEL
Anion gap: 11 (ref 5–15)
BUN: 10 mg/dL (ref 8–23)
CO2: 24 mmol/L (ref 22–32)
Calcium: 9.2 mg/dL (ref 8.9–10.3)
Chloride: 105 mmol/L (ref 98–111)
Creatinine, Ser: 1.34 mg/dL — ABNORMAL HIGH (ref 0.61–1.24)
GFR calc Af Amer: 58 mL/min — ABNORMAL LOW (ref >60)
GFR calc non Af Amer: 50 mL/min — ABNORMAL LOW (ref >60)
Glucose, Bld: 93 mg/dL (ref 70–99)
Potassium: 4 mmol/L (ref 3.5–5.1)
Sodium: 140 mmol/L (ref 135–145)

## 2018-08-13 LAB — LIPID PANEL
Cholesterol: 162 mg/dL (ref 0–200)
HDL: 34 mg/dL — ABNORMAL LOW (ref >40)
LDL Cholesterol: 99 mg/dL (ref 0–99)
Total CHOL/HDL Ratio: 4.8 RATIO
Triglycerides: 143 mg/dL (ref <150)
VLDL: 29 mg/dL (ref 0–40)

## 2018-08-13 LAB — ECHOCARDIOGRAM COMPLETE
Height: 74 in
Weight: 2945.35 oz

## 2018-08-13 LAB — TROPONIN I
Troponin I: <0.03 ng/mL (ref <0.03)
Troponin I: <0.03 ng/mL (ref <0.03)

## 2018-08-13 MED ORDER — AMLODIPINE BESYLATE 2.5 MG PO TABS
2.5000 mg | ORAL_TABLET | Freq: Once | ORAL | Status: AC
  Administered 2018-08-13: 2.5 mg via ORAL
  Filled 2018-08-13: qty 1

## 2018-08-13 MED ORDER — REGADENOSON 0.4 MG/5ML IV SOLN
0.4000 mg | Freq: Once | INTRAVENOUS | Status: AC
  Administered 2018-08-13: 10:00:00 0.4 mg via INTRAVENOUS
  Filled 2018-08-13: qty 5

## 2018-08-13 MED ORDER — TECHNETIUM TC 99M TETROFOSMIN IV KIT: 30.0000 | PACK | Freq: Once | INTRAVENOUS | Status: DC | PRN

## 2018-08-13 MED ORDER — AMLODIPINE BESYLATE 2.5 MG PO TABS: 5.0000 mg | ORAL_TABLET | Freq: Every day | ORAL | Status: DC

## 2018-08-13 MED ORDER — AMLODIPINE BESYLATE 5 MG PO TABS: 5.0000 mg | ORAL_TABLET | Freq: Every day | ORAL | 3 refills | Status: DC

## 2018-08-13 MED ORDER — REGADENOSON 0.4 MG/5ML IV SOLN
INTRAVENOUS | Status: AC
  Administered 2018-08-13: 10:00:00 0.4 mg via INTRAVENOUS
  Filled 2018-08-13: qty 5

## 2018-08-13 MED ORDER — TECHNETIUM TC 99M TETROFOSMIN IV KIT
10.0000 | PACK | Freq: Once | INTRAVENOUS | Status: AC | PRN
  Administered 2018-08-13: 09:00:00 10 via INTRAVENOUS

## 2018-08-13 NOTE — Procedures (Signed)
Echo attempted. Patient not in room. Will attempt again. 

## 2018-08-13 NOTE — Discharge Summary (Signed)
Discharge Summary    Patient ID: Devon [name] MRN: [id number]; DOB: [birth year]  Admit date: 08/12/2018 Discharge date: 08/13/2018  Primary Care Provider: Burnard Bunting, MD  Primary Cardiologist: Peter Martinique, MD  Primary Electrophysiologist:  None   Discharge Diagnoses    Principal Problem:   Chest pain with moderate risk for cardiac etiology Active Problems:   CAD (coronary artery disease)   HTN (hypertension)   HLD (hyperlipidemia)   Allergies Allergies  Allergen Reactions  . Metoprolol Other (See Comments)    Chest pressure  . Statins Other (See Comments)    Myalgias  . Zetia [Ezetimibe] Other (See Comments)    Myalgias    Diagnostic Studies/Procedures    Nuclear stress test 08/13/18 IMPRESSION: 1. No reversible ischemia or infarction.  2. Slight generalized hypokinesia.  3. Left ventricular ejection fraction 45%  4. Non invasive risk stratification*: Intermediate based on mildly diminished ejection fraction   Echo 08/13/18:  1. The left ventricle has low normal systolic function, with an ejection fraction of 50-55%. The cavity size was normal. There is mildly increased left ventricular wall thickness. Left ventricular diastolic Doppler parameters are consistent with  impaired relaxation. No evidence of left ventricular regional wall motion abnormalities.  2. The right ventricle has normal systolic function. The cavity was normal. There is no increase in right ventricular wall thickness.  3. There is mild mitral annular calcification present.  4. The aortic valve is tricuspid. Severely thickening of the aortic valve. Severe calcifcation of the aortic valve. Aortic valve regurgitation was not assessed by color flow Doppler.  5. The interatrial septum appears to be lipomatous.   Cath 10/2013 Coronary angiography: Coronary dominance:right  Left mainstem: Normal.   Left anterior descending (LAD): There is severe disease in the mid LAD with  competitive flow distal to this from the LIMA graft. The first and second diagonal branches are normal.   Left circumflex (LCx): Normal. The stent in the mid vessel is widely patent.  Right coronary artery (RCA): Mild 20% disease in the mid vessel.   LIMA to the LAD is widely patent.   Left ventriculography: Left ventricular systolic function is normal, LVEF is estimated at 55-65%, there is no significant mitral regurgitation   Final Conclusions:  1. Single vessel obstructive CAD 2. Patent LIMA to the LAD. 3. Normal LV function.  History of Present Illness     Mr. Saffo presented for evaluation of chest pain.  At baseline, patient is very active.  Patient had chest pain few days ago after raking leaves for few hours.  Associated with shortness of breath.  It relieved with sublingual nitroglycerin x1 and rest.  He also helped his budy with heavy lifting in his shop.  Did not felt well for past few days.  This morning after taking shower he noted substernal chest discomfort with shortness of breath.  He took his blood pressure and it was elevated.  His symptoms resolved with sublingual nitroglycerin x1.  He called his daughter-in-law who is a Librarian, academic and recommended further evaluation.  Patient denies palpitation, dizziness, orthopnea, PND, syncope or lower extremity edema.  Compliant with his medication.  He required to take his sublingual nitroglycerin after long time.  This might be expired.  In ER, EKG shows sinus rhythm with PVCs.  Creatinine 1.53.  Troponin negative.  BNP normal.  Pending COVID.  Cardiology is asked for further evaluation.  Hospital Course     Consultants:   Chest pain, known CAD Patient was admitted  to cardiology service. Troponin remained negative and EKG without acute ischemic changes. Given his history of CAD, he underwent nuclear stress testing. Stress test did not show reversible ischemia, but did show reduced EF. Echocardiogram revealed  normal EF. Stress test reviewed by Dr. Delton SeeNelson, who advised no further workup.    Hypertension AKI ACEI initially held for AKI. Repeat BMP with sCr 1.34, down from 1.53 on admission. I will continue to hold his ACEI for now. Advised him to take his pressure daily. Norvasc started at 2.5 mg. Increase to 5 mg daily. Continue toprol 12.5 mg daily (home med). If pressures consistently elevated, continue to titrate. May be able to transition back to benazepril once renal function is improved.   Will need repeat BMP in 1 week. If normal, consider restarting benazepril. Baseline creatinine was 0.97.   Hyperlipidemia Continue statin.    Patient seen and examined by Dr. Delton SeeNelson and deemed stable for discharge. Follow up with our office will be made. BMP in 1 week. Keep pressure log.  _____________  Discharge Vitals Blood pressure 130/87, pulse 79, temperature 98.4 F (36.9 C), temperature source Oral, resp. rate 20, height 6\' 2"  (1.88 m), weight 83.5 kg, SpO2 98 %.  Filed Weights   08/12/18 2127 08/13/18 0500  Weight: 83.6 kg 83.5 kg    Labs & Radiologic Studies    CBC Recent Labs    08/12/18 1549  WBC 8.7  NEUTROABS 6.6  HGB 17.0  HCT 48.8  MCV 90.5  PLT 142*   Basic Metabolic Panel Recent Labs    40/98/1104/10/23 1549 08/12/18 2141 08/13/18 0314  NA 138  --  140  K 4.1  --  4.0  CL 103  --  105  CO2 23  --  24  GLUCOSE 99  --  93  BUN 12  --  10  CREATININE 1.53* 1.37* 1.34*  CALCIUM 9.4  --  9.2   Liver Function Tests Recent Labs    08/12/18 1549  AST 34  ALT 36  ALKPHOS 87  BILITOT 0.7  PROT 6.7  ALBUMIN 4.1   No results for input(s): LIPASE, AMYLASE in the last 72 hours. Cardiac Enzymes Recent Labs    08/12/18 2141 08/13/18 0314 08/13/18 1032  TROPONINI <0.03 <0.03 <0.03   BNP Invalid input(s): POCBNP D-Dimer No results for input(s): DDIMER in the last 72 hours. Hemoglobin A1C No results for input(s): HGBA1C in the last 72 hours. Fasting Lipid  Panel Recent Labs    08/13/18 0314  CHOL 162  HDL 34*  LDLCALC 99  TRIG [id number]  CHOLHDL 4.8   Thyroid Function Tests No results for input(s): TSH, T4TOTAL, T3FREE, THYROIDAB in the last 72 hours.  Invalid input(s): FREET3 _____________  Nm Myocar Multi W/spect W/wall Motion / Ef  Result Date: 08/13/2018 CLINICAL DATA:  Acute coronary syndrome EXAM: MYOCARDIAL IMAGING WITH SPECT (REST AND PHARMACOLOGIC-STRESS) GATED LEFT VENTRICULAR WALL MOTION STUDY LEFT VENTRICULAR EJECTION FRACTION TECHNIQUE: Standard myocardial SPECT imaging was performed after resting intravenous injection of 10 mCi Tc-7567m tetrofosmin. Subsequently, intravenous infusion of Lexiscan was performed under the supervision of the Cardiology staff. At peak effect of the drug, 30 mCi Tc-2567m tetrofosmin was injected intravenously and standard myocardial SPECT imaging was performed. Quantitative gated imaging was also performed to evaluate left ventricular wall motion, and estimate left ventricular ejection fraction. COMPARISON:  None. FINDINGS: Perfusion: No decreased activity in the left ventricle on stress imaging to suggest reversible ischemia or infarction. Wall Motion: Slight generalized hypokinesia. Left  Ventricular Ejection Fraction: 45 % End diastolic volume 91 ml End systolic volume 50 ml IMPRESSION: 1. No reversible ischemia or infarction. 2. Slight generalized hypokinesia. 3. Left ventricular ejection fraction 45% 4. Non invasive risk stratification*: Intermediate based on mildly diminished ejection fraction *2012 Appropriate Use Criteria for Coronary Revascularization Focused Update: J Am Coll Cardiol. 2012;59(9):857-881. http://content.dementiazones.comonlinejacc.org/article.aspx?articleid=1201161 Electronically Signed   By: Charlett NoseKevin  Dover M.D.   On: 08/13/2018 15:07   Dg Chest Port 1 View  Result Date: 08/12/2018 CLINICAL DATA:  Chest pain. EXAM: PORTABLE CHEST 1 VIEW COMPARISON:  Radiographs of October 20, 2013. FINDINGS: Stable  cardiomediastinal silhouette. No pneumothorax or pleural effusion is noted. Sternotomy wires are noted. Both lungs are clear. The visualized skeletal structures are unremarkable. IMPRESSION: No active disease. Electronically Signed   By: Lupita RaiderJames  Green Jr M.D.   On: 08/12/2018 16:21   Disposition   Pt is being discharged home today in good condition.  Follow-up Plans & Appointments    Follow-up Information    SwazilandJordan, Peter M, MD Follow up in 1 week(s).   Specialty:  Cardiology Contact information: [address] [place] [address] [phone]        [facility] Follow up in 1 week(s).   Specialty:  Cardiology Why:  [facility] Contact information: [address] [place] [id number] [phone]         Discharge Instructions    Diet - low sodium heart healthy   Complete by:  As directed    Discharge instructions   Complete by:  As directed    Keep a BP log - take pressure daily 2 hours after morning medications. If pressure is consistently greater than 140/90, please call our office for changes in medication.   Come to [facility] to draw labs next Monday, 08/19/18.   Increase activity slowly   Complete by:  As directed       Discharge Medications   Allergies as of 08/13/2018      Reactions   Metoprolol Other (See Comments)   Chest pressure   Statins Other (See Comments)   Myalgias   Zetia [ezetimibe] Other (See Comments)   Myalgias      Medication List    STOP taking these medications   benazepril 40 MG tablet Commonly known as:  LOTENSIN     TAKE these medications   allopurinol 100 MG tablet Commonly known as:  ZYLOPRIM Take 100 mg by mouth 2 (two) times daily.   amLODipine 5 MG tablet Commonly known as:  NORVASC Take 1 tablet (5 mg total) by mouth daily. Start taking on:  August 14, 2018   aspirin EC 81 MG tablet Take 81 mg by mouth daily. What changed:  Another medication with the  same name was removed. Continue taking this medication, and follow the directions you see here.   desonide 0.05 % cream Commonly known as:  DESOWEN Apply 1 application topically daily as needed (for spots).   fish oil-omega-3 fatty acids 1000 MG capsule Take 1 g by mouth daily.   metoprolol succinate 25 MG 24 hr tablet Commonly known as:  TOPROL-XL TAKE 1/2 TABLET(12.5 MG) BY MOUTH DAILY What changed:  See the new instructions.   nitroGLYCERIN 0.4 MG SL tablet Commonly known as:  NITROSTAT Place 0.4 mg under the tongue every 5 (five) minutes as needed for chest pain.        Acute coronary syndrome (MI, NSTEMI, STEMI, etc) this admission?: No.  Outstanding Labs/Studies   BMP in 1 week  Duration of Discharge Encounter   Greater than 30 minutes including physician time.  Signed, Roe RutherfordAngela Nicole Zadie Deemer, PA 08/13/2018, 7:59 PM

## 2018-08-13 NOTE — Progress Notes (Signed)
Pt tolerated lexiscan without incident.  Vitals in flowsheet.  Food and beverage provided at completion of test

## 2018-08-13 NOTE — Plan of Care (Signed)
  Problem: Activity: Goal: Risk for activity intolerance will decrease Outcome: Progressing Note:  Pt has been able to move around in the room independently during my shift.

## 2018-08-13 NOTE — Progress Notes (Signed)
Patient is being discharged home.Discharge instructions to patient and verbalized understanding.

## 2018-08-13 NOTE — Progress Notes (Addendum)
Progress Note  Patient Name: Devon [name] Date of Encounter: 08/13/2018  Primary Cardiologist: Dr. Martinique   Subjective   Pt seen in nuclear medicine. Denies chest pain, SOB  Inpatient Medications    Scheduled Meds: . allopurinol  100 mg Oral BID  . amLODipine  2.5 mg Oral Daily  . aspirin EC  81 mg Oral Daily  . heparin  5,000 Units Subcutaneous Q8H  . metoprolol succinate  12.5 mg Oral Daily  . omega-3 acid ethyl esters  1 g Oral Daily  . regadenoson  0.4 mg Intravenous Once   Continuous Infusions:  PRN Meds: acetaminophen, nitroGLYCERIN, ondansetron (ZOFRAN) IV   Vital Signs    Vitals:   08/13/18 0038 08/13/18 0500 08/13/18 0550 08/13/18 0729  BP: 114/65  123/75 124/74  Pulse: 75  67 65  Resp: 18  18 18   Temp: 98.3 F (36.8 C)  98.2 F (36.8 C) 98.4 F (36.9 C)  TempSrc: Oral  Oral Oral  SpO2: 100%  97% 99%  Weight:  83.5 kg    Height:        Intake/Output Summary (Last 24 hours) at 08/13/2018 0900 Last data filed at 08/13/2018 0500 Gross per 24 hour  Intake -  Output 400 ml  Net -400 ml   Filed Weights   08/12/18 2127 08/13/18 0500  Weight: 83.6 kg 83.5 kg   Physical Exam   General: Well developed, well nourished, NAD Neck: Negative for carotid bruits. No JVD Lungs:Clear to ausculation bilaterally. No wheezes. Breathing is unlabored. Cardiovascular: RRR with S1 S2. No murmurs, rubs, gallops, or LV heave appreciated. Abdomen: Soft, non-tender, non-distended. No obvious abdominal masses. Extremities: No edema. No clubbing or cyanosis. DP/PT pulses 1+ bilaterally Neuro: Alert and oriented. No focal deficits. No facial asymmetry. MAE spontaneously. Psych: Responds to questions appropriately with normal affect.    Labs    Chemistry Recent Labs  Lab 08/12/18 1549 08/12/18 2141 08/13/18 0314  NA 138  --  140  K 4.1  --  4.0  CL 103  --  105  CO2 23  --  24  GLUCOSE 99  --  93  BUN 12  --  10  CREATININE 1.53* 1.37* 1.34*   CALCIUM 9.4  --  9.2  PROT 6.7  --   --   ALBUMIN 4.1  --   --   AST 34  --   --   ALT 36  --   --   ALKPHOS 87  --   --   BILITOT 0.7  --   --   GFRNONAA 42* 48* 50*  GFRAA 49* 56* 58*  ANIONGAP 12  --  11     Hematology Recent Labs  Lab 08/12/18 1549  WBC 8.7  RBC 5.39  HGB 17.0  HCT 48.8  MCV 90.5  MCH 31.5  MCHC 34.8  RDW 13.5  PLT 142*    Cardiac Enzymes Recent Labs  Lab 08/12/18 1549 08/12/18 2141 08/13/18 0314  TROPONINI <0.03 <0.03 <0.03   No results for input(s): TROPIPOC in the last 168 hours.   BNP Recent Labs  Lab 08/12/18 1549  BNP 41.4    DDimer No results for input(s): DDIMER in the last 168 hours.   Radiology    Dg Chest Port 1 View  Result Date: 08/12/2018 CLINICAL DATA:  Chest pain. EXAM: PORTABLE CHEST 1 VIEW COMPARISON:  Radiographs of October 20, 2013. FINDINGS: Stable cardiomediastinal silhouette. No pneumothorax or pleural effusion is  noted. Sternotomy wires are noted. Both lungs are clear. The visualized skeletal structures are unremarkable. IMPRESSION: No active disease. Electronically Signed   By: Lupita RaiderJames  Green Jr M.D.   On: 08/12/2018 16:21   Telemetry    08/13/2018 NSR - Personally Reviewed  ECG    08/12/2018 NSR with PVC and TWI in anterolateral leads - Personally Reviewed  Cardiac Studies   Cath 10/2013 Coronary angiography: Coronary dominance:right  Left mainstem: Normal.   Left anterior descending (LAD): There is severe disease in the mid LAD with competitive flow distal to this from the LIMA graft. The first and second diagonal branches are normal.   Left circumflex (LCx): Normal. The stent in the mid vessel is widely patent.  Right coronary artery (RCA): Mild 20% disease in the mid vessel.   LIMA to the LAD is widely patent.   Left ventriculography: Left ventricular systolic function is normal, LVEF is estimated at 55-65%, there is no significant mitral regurgitation   Final Conclusions:  1. Single  vessel obstructive CAD 2. Patent LIMA to the LAD. 3. Normal LV function.  Recommendations: Continue prior medical therapy. Patent is stable for DC today.  Patient Profile     78 y.o. male with hx of CAD s/p remote CABG and then PCI, HTN and HLD ( statin intolerance) presents for chest pain.   Assessment & Plan    1. Chest pain: -Troponin negative x2, <0.03, <0.03 -EKG without acute ischemic changes but has PVCs -Continue aspirin and metoprolol -Echocardiogram pending today -NPO for stress test today>>pending results    2. Hypertension: -Elevated, 157/110, 124/74, 123/75, 114/65 -Hold benazepril given AKI>>continue Toprol-XL.   -We will increase amlodipine to 5 mg daily   3.  AKI: -Likely in the setting of dehydration  -Hold ACE for now until resolved -Creatinine today, 1.34 down from 1.37 yesterday with a baseline of 1.1  4.  Hyperlipidemia: -Known statin intolerance.   -Previously declined PCSK9 inhibitor evaluation.  -We will refer to the lipid clinic again -LDL, 99 on 08/13/2018 with a goal of 70mg /dl   Signed, Georgie ChardJill McDaniel NP-C [facility] Pager: [phone] 08/13/2018, 9:00 AM     For questions or updates, please contact   Please consult [email or web address] for contact info under Cardiology/STEMI.  The patient was seen, examined and discussed with Georgie ChardJill McDaniel, NP  and I agree with the above.   The patient is chest pain free but felt mild chest tightness at the stress test. Results of the stress test and an echocardiogram are pending. Troponin is negative  X 2. Crea has improved 1.57->1.34, we will keep holding lisinopril and continue amlodipine. BP is normal. If normal stress test we will discharge home.  Tobias AlexanderKatarina Glen Kesinger, MD 08/13/2018

## 2018-08-13 NOTE — Progress Notes (Signed)
  Echocardiogram 2D Echocardiogram has been performed.  Devon [name] 08/13/2018, 12:04 PM

## 2018-08-13 NOTE — Telephone Encounter (Signed)
Patient is still currently admitted.  Will take out of triage since patient is being evaluated at hospital.

## 2018-08-13 NOTE — Progress Notes (Signed)
   Webb Laws Wolgamott presented for a nuclear stress test today.  No immediate complications.  Stress imaging is pending at this time.  Preliminary EKG findings may be listed in the chart, but the stress test result will not be finalized until perfusion imaging is complete.   Kathyrn Drown, NP-C 08/13/2018, 9:40 AM

## 2018-08-13 NOTE — Discharge Instructions (Addendum)
YOUR CARDIOLOGY TEAM HAS ARRANGED FOR AN E-VISIT FOR YOUR APPOINTMENT - PLEASE REVIEW IMPORTANT INFORMATION BELOW SEVERAL DAYS PRIOR TO YOUR APPOINTMENT  Due to the recent COVID-19 pandemic, we are transitioning in-person office visits to tele-medicine visits in an effort to decrease unnecessary exposure to our patients, their families, and staff. These visits are billed to your insurance just like a normal visit is. We also encourage you to sign up for MyChart if you have not already done so. You will need a smartphone if possible. For patients that do not have this, we can still complete the visit using a regular telephone but do prefer a smartphone to enable video when possible. You may have a family member that lives with you that can help. If possible, we also ask that you have a blood pressure cuff and scale at home to measure your blood pressure, heart rate and weight prior to your scheduled appointment. Patients with clinical needs that need an in-person evaluation and testing will still be able to come to the office if absolutely necessary. If you have any questions, feel free to call our office.   _____________________________________________________________________________________________________  Notes for your family:  Nuclear stress test was negative for reversible ischemia.  Echocardiogram with EF of 50-55%, no wall motion abnormality.    2-3 DAYS BEFORE YOUR APPOINTMENT  You will receive a telephone call from one of our [facility] team members - your caller ID may say "Unknown caller." If this is a video visit, we will walk you through how to get the video launched on your phone. We will remind you check your blood pressure, heart rate and weight prior to your scheduled appointment. If you have an Apple Watch or Kardia, please upload any pertinent ECG strips the day before or morning of your appointment to MyChart. Our staff will also make sure you have reviewed the consent and agree  to move forward with your scheduled tele-health visit.     THE DAY OF YOUR APPOINTMENT  Approximately 15 minutes prior to your scheduled appointment, you will receive a telephone call from one of [facility] team - your caller ID may say "Unknown caller."  Our staff will confirm medications, vital signs for the day and any symptoms you may be experiencing. Please have this information available prior to the time of visit start. It may also be helpful for you to have a pad of paper and pen handy for any instructions given during your visit. They will also walk you through joining the smartphone meeting if this is a video visit.    CONSENT FOR TELE-HEALTH VISIT - PLEASE REVIEW  I hereby voluntarily request, consent and authorize [facility] and its employed or contracted physicians, physician assistants, nurse practitioners or other licensed health care professionals (the Practitioner), to provide me with telemedicine health care services (the Services") as deemed necessary by the treating Practitioner. I acknowledge and consent to receive the Services by the Practitioner via telemedicine. I understand that the telemedicine visit will involve communicating with the Practitioner through live audiovisual communication technology and the disclosure of certain medical information by electronic transmission. I acknowledge that I have been given the opportunity to request an in-person assessment or other available alternative prior to the telemedicine visit and am voluntarily participating in the telemedicine visit.  I understand that I have the right to withhold or withdraw my consent to the use of telemedicine in the course of my care at any time, without affecting my right to future care  or treatment, and that the Practitioner or I may terminate the telemedicine visit at any time. I understand that I have the right to inspect all information obtained and/or recorded in the course of the telemedicine visit  and may receive copies of available information for a reasonable fee.  I understand that some of the potential risks of receiving the Services via telemedicine include:   Delay or interruption in medical evaluation due to technological equipment failure or disruption;  Information transmitted may not be sufficient (e.g. poor resolution of images) to allow for appropriate medical decision making by the Practitioner; and/or   In rare instances, security protocols could fail, causing a breach of personal health information.  Furthermore, I acknowledge that it is my responsibility to provide information about my medical history, conditions and care that is complete and accurate to the best of my ability. I acknowledge that Practitioner's advice, recommendations, and/or decision may be based on factors not within their control, such as incomplete or inaccurate data provided by me or distortions of diagnostic images or specimens that may result from electronic transmissions. I understand that the practice of medicine is not an exact science and that Practitioner makes no warranties or guarantees regarding treatment outcomes. I acknowledge that I will receive a copy of this consent concurrently upon execution via email to the email address I last provided but may also request a printed copy by calling the office of [place].    I understand that my insurance will be billed for this visit.   I have read or had this consent read to me.  I understand the contents of this consent, which adequately explains the benefits and risks of the Services being provided via telemedicine.   I have been provided ample opportunity to ask questions regarding this consent and the Services and have had my questions answered to my satisfaction.  I give my informed consent for the services to be provided through the use of telemedicine in my medical care  By participating in this telemedicine visit I agree to the  above.

## 2018-08-14 ENCOUNTER — Telehealth: Payer: Self-pay | Admitting: Cardiology

## 2018-08-14 ENCOUNTER — Telehealth: Payer: Self-pay | Admitting: Physician Assistant

## 2018-08-14 NOTE — Telephone Encounter (Signed)
Left a detailed message for the patient about upcoming appointment with [name], PA-C on 09-03-2018 at 9:15AM is an in person office visit and to call the office if there are any questions.

## 2018-08-14 NOTE — Telephone Encounter (Signed)
LVM for patient to call and schedule hospital followup with either Devon [name] or [facility] in 2 weeks.  Can be virtual.

## 2018-08-14 NOTE — Telephone Encounter (Signed)
New Message   Patient scheduled for 6/30 for office visit would like a nurse to call to see if it should be a virtual visit or office visit.  Please call patient back.

## 2018-08-19 ENCOUNTER — Other Ambulatory Visit: Payer: Self-pay | Admitting: *Deleted

## 2018-08-19 DIAGNOSIS — I2510 Atherosclerotic heart disease of native coronary artery without angina pectoris: Secondary | ICD-10-CM

## 2018-08-19 LAB — BASIC METABOLIC PANEL
BUN/Creatinine Ratio: 10 (ref 10–24)
BUN: 13 mg/dL (ref 8–27)
CO2: 24 mmol/L (ref 20–29)
Calcium: 9.7 mg/dL (ref 8.6–10.2)
Chloride: 100 mmol/L (ref 96–106)
Creatinine, Ser: 1.33 mg/dL — ABNORMAL HIGH (ref 0.76–1.27)
GFR calc Af Amer: 58 mL/min/{1.73_m2} — ABNORMAL LOW (ref >59)
GFR calc non Af Amer: 50 mL/min/{1.73_m2} — ABNORMAL LOW (ref >59)
Glucose: 162 mg/dL — ABNORMAL HIGH (ref 65–99)
Potassium: 4.3 mmol/L (ref 3.5–5.2)
Sodium: 141 mmol/L (ref 134–144)

## 2018-08-26 ENCOUNTER — Other Ambulatory Visit: Payer: Self-pay | Admitting: Physician Assistant

## 2018-08-26 DIAGNOSIS — E7800 Pure hypercholesterolemia, unspecified: Secondary | ICD-10-CM

## 2018-08-26 DIAGNOSIS — I2510 Atherosclerotic heart disease of native coronary artery without angina pectoris: Secondary | ICD-10-CM

## 2018-08-26 DIAGNOSIS — I10 Essential (primary) hypertension: Secondary | ICD-10-CM

## 2018-09-02 ENCOUNTER — Telehealth: Payer: Self-pay | Admitting: Physician Assistant

## 2018-09-02 LAB — BASIC METABOLIC PANEL
BUN/Creatinine Ratio: 11 (ref 10–24)
BUN: 14 mg/dL (ref 8–27)
CO2: 23 mmol/L (ref 20–29)
Calcium: 9.6 mg/dL (ref 8.6–10.2)
Chloride: 100 mmol/L (ref 96–106)
Creatinine, Ser: 1.22 mg/dL (ref 0.76–1.27)
GFR calc Af Amer: 64 mL/min/{1.73_m2} (ref >59)
GFR calc non Af Amer: 56 mL/min/{1.73_m2} — ABNORMAL LOW (ref >59)
Glucose: 92 mg/dL (ref 65–99)
Potassium: 4.2 mmol/L (ref 3.5–5.2)
Sodium: 139 mmol/L (ref 134–144)

## 2018-09-02 NOTE — Telephone Encounter (Signed)
° ° °  COVID-19 Pre-Screening Questions:   In the past 7 to 10 days have you had a cough,  shortness of breath, headache, congestion, fever (100 or greater) body aches, chills, sore throat, or sudden loss of taste or sense of smell? no  Have you been around anyone with known Covid 19. no  Have you been around anyone who is awaiting Covid 19 test results in the past 7 to 10 days? no Have you been around anyone who has been exposed to Covid 19, or has mentioned symptoms of Covid 19 within the past 7 to 10 days? no If you have any concerns/questions about symptoms patients report during screening (either on the phone or at threshold). Contact the provider seeing the patient or DOD for further guidance.  If neither are available contact a member of the leadership team.  I called and confirmed appt with pt for 09-03-18 with [name]. Pt asked if his son come in with is for his appointment. Son is primary care giver and need to hear what provider says. Sometimes pt  remember all that was said to him. I contacted Writer. She said because of these circumstances son could come in for visit.

## 2018-09-03 ENCOUNTER — Ambulatory Visit (INDEPENDENT_AMBULATORY_CARE_PROVIDER_SITE_OTHER): Payer: Medicare Other | Admitting: Physician Assistant

## 2018-09-03 ENCOUNTER — Encounter: Payer: Self-pay | Admitting: Physician Assistant

## 2018-09-03 ENCOUNTER — Other Ambulatory Visit: Payer: Self-pay

## 2018-09-03 VITALS — BP 126/78 | HR 84 | Temp 97.3°F | Ht 73.0 in | Wt 187.4 lb

## 2018-09-03 DIAGNOSIS — I2510 Atherosclerotic heart disease of native coronary artery without angina pectoris: Secondary | ICD-10-CM | POA: Diagnosis not present

## 2018-09-03 DIAGNOSIS — I10 Essential (primary) hypertension: Secondary | ICD-10-CM | POA: Diagnosis not present

## 2018-09-03 DIAGNOSIS — E7800 Pure hypercholesterolemia, unspecified: Secondary | ICD-10-CM

## 2018-09-03 MED ORDER — NITROGLYCERIN 0.4 MG SL SUBL: 0.4000 mg | SUBLINGUAL_TABLET | SUBLINGUAL | 3 refills | Status: AC | PRN

## 2018-09-03 NOTE — Patient Instructions (Signed)
Medication Instructions:   Your physician recommends that you continue on your current medications as directed. Please refer to the Current Medication list given to you today.  If you need a refill on your cardiac medications before your next appointment, please call your pharmacy.   Lab work:  NONE ordered at this time of appointment   If you have labs (blood work) drawn today and your tests are completely normal, you will receive your results only by: . MyChart Message (if you have MyChart) OR . A paper copy in the mail If you have any lab test that is abnormal or we need to change your treatment, we will call you to review the results.  Testing/Procedures:  NONE ordered at this time of appointment   Follow-Up: At [facility], you and your health needs are our priority.  As part of our continuing mission to provide you with exceptional heart care, we have created designated Provider Care Teams.  These Care Teams include your primary Cardiologist (physician) and Advanced Practice Providers (APPs -  Physician Assistants and Nurse Practitioners) who all work together to provide you with the care you need, when you need it. You will need a follow up appointment in 6 months.  Please call our office 2 months in advance to schedule this appointment.  You may see Peter Jordan, MD or one of the following Advanced Practice Providers on your designated Care Team: Hao Meng, PA-C . Angela [facility], PA-C  Any Other Special Instructions Will Be Listed Below (If Applicable).    

## 2018-09-03 NOTE — Progress Notes (Signed)
Cardiology Clinic Note   Patient Name: Devon [name] Date of Encounter: 09/03/2018  Primary Care Provider:  Geoffry ParadiseAronson, Richard, MD Primary Cardiologist:  Peter SwazilandJordan, MD  Patient Profile    Devon [name] 78 y.o. male status post hospital admission for chest pain.  Past Medical History    Past Medical History:  Diagnosis Date   Bilateral plantar fasciitis    Coronary artery disease    a. s/p CABG 1991 with LIMA to LAD. b. s/p DES to mid LCX 06/2011. c. Cath 10/2013: severe disease in LAD with competitive flow distal to this from LIMA graft. EF 55-60%. Continued med rx.   H/O: gout    Hyperlipidemia    statin intolerant; intolerant to zetia   Hypertension    Thrombocytopenia [facility](HCC)    Past Surgical History:  Procedure Laterality Date   CARDIAC CATHETERIZATION  April 2013   DES to mid LCX; EF of 50%.    CORONARY ARTERY BYPASS GRAFT  1991   severe 90% ostial LAD lesion with LIMA to LAD   LEFT HEART CATHETERIZATION WITH CORONARY ANGIOGRAM N/A 10/21/2013   Procedure: LEFT HEART CATHETERIZATION WITH CORONARY ANGIOGRAM;  Surgeon: Peter M SwazilandJordan, MD;  [facility];  Service: Cardiovascular;  Laterality: N/A;   LEFT HEART CATHETERIZATION WITH CORONARY/GRAFT ANGIOGRAM N/A 07/03/2011   Procedure: LEFT HEART CATHETERIZATION WITH Isabel CapriceORONARY/GRAFT ANGIOGRAM;  Surgeon: Herby Abrahamhomas D Stuckey, MD;  [facility];  Service: Cardiovascular;  Laterality: N/A;    Allergies  Allergies  Allergen Reactions   Metoprolol Other (See Comments)    Chest pressure   Statins Other (See Comments)    Myalgias   Zetia [Ezetimibe] Other (See Comments)    Myalgias    History of Present Illness    Devon [name] was discharged 08/13/2018 after a bout of chest pain.  Typically he is very active doing yard work and other activities however, on 08/12/2018 he developed left-sided chest pain with shortness of breath.  Chest pain was relieved with 1 sublingual nitroglycerin and  rest.  He also indicated that he has been helping a friend move some heavy lifting in his shop and has not felt well for a few days.  His daughter-in-law who is a Advice workerphysician assistant recommended further evaluation.  In the ED emergency room, his EKG showed sinus rhythm with PVCs, creatinine was 1.53, troponins were negative, BNP was normal, and his COVID screen was also negative.  He has a past medical history of coronary artery disease (CABG 1991, LIMA to LAD), cardiac catheterization 2013 DES to mid circumflex (EF 50%).,  2015 cardiac catheterization (patent stent to circumflex and a widely patent LIMA to LAD graft, no new stenosis EF 55 to 65%). Hypertension, bilateral plantar fasciitis, gout, hyperlipidemia, and thrombocytopenia.    Today he presents with his son, and states he feels well and has started walking again.  He states that he is not as active in the yard.  However he tracks his steps and was back up to over 6000 steps last week until he had a muscle pull in his back.  However, he says since he was discharged from the hospital he has been walking up inclines and has not noticed any discomfort in his chest.  [facility]  Prior to the virus [facility].  He denies chest pain, shortness of breath, palpitations,  lower extremity edema, melena, hematuria, hemoptysis, presyncope, syncope, orthopnea and PND.    Home Medications    Prior to Admission medications   Medication Sig Start Date End Date Taking? Authorizing Provider  allopurinol (ZYLOPRIM) 100 MG tablet Take 100 mg by mouth 2 (two) times daily.     [provider]  amLODipine (NORVASC) 5 MG tablet Take 1 tablet (5 mg total) by mouth daily. 08/14/18   Marcelino Dusteruke, Angela Nicole, PA  aspirin EC 81 MG tablet Take 81 mg by mouth daily.    [provider]  desonide (DESOWEN) 0.05 % cream Apply 1 application topically  daily as needed (for spots).    [provider]  fish oil-omega-3 fatty acids 1000 MG capsule Take 1 g by mouth daily.    [provider]  metoprolol succinate (TOPROL-XL) 25 MG 24 hr tablet TAKE 1/2 TABLET(12.5 MG) BY MOUTH DAILY Patient taking differently: Take 12.5 mg by mouth daily.  12/31/17   SwazilandJordan, Peter M, MD  nitroGLYCERIN (NITROSTAT) 0.4 MG SL tablet Place 0.4 mg under the tongue every 5 (five) minutes as needed for chest pain.    [provider]    Family History    Family History  Problem Relation Age of Onset   Alzheimer's disease Mother    Healthy Sister    He indicated that his mother is deceased. He indicated that his father is deceased. He indicated that his sister is alive.  Social History    Social History   Socioeconomic History   Marital status: Married    Spouse name: Not on file   Number of children: Not on file   Years of education: Not on file   Highest education level: Not on file  Occupational History   Not on file  Social Needs   Financial resource strain: Not on file   Food insecurity    Worry: Not on file    Inability: Not on file   Transportation needs    Medical: Not on file    Non-medical: Not on file  Tobacco Use   Smoking status: Never Smoker   Smokeless tobacco: Never Used  Substance and Sexual Activity   Alcohol use: Yes    Comment: Occassional Use   Drug use: No   Sexual activity: Not Currently  Lifestyle   Physical activity    Days per week: Not on file    Minutes per session: Not on file   Stress: Not on file  Relationships   Social connections    Talks on phone: Not on file    Gets together: Not on file    Attends religious service: Not on file    Active member of club or organization: Not on file    Attends meetings of clubs or organizations: Not on file    Relationship status: Not on file   Intimate partner violence    Fear of current or ex partner: Not on file     Emotionally abused: Not on file    Physically abused: Not on file    Forced sexual activity: Not on file  Other Topics Concern   Not on file  Social History Narrative   Not on file     Review of Systems    General:  No chills, fever, night sweats or weight changes.  Cardiovascular:  No chest pain, dyspnea on exertion, edema, orthopnea, palpitations, paroxysmal nocturnal dyspnea. Dermatological: No rash, lesions/masses Respiratory: No cough, dyspnea Urologic: No hematuria, dysuria Abdominal:  No nausea, vomiting, diarrhea, bright red blood per rectum, melena, or hematemesis Neurologic:  No visual changes, wkns, changes in mental status. All other systems reviewed and are otherwise negative except as noted above.  Physical Exam    VS:  BP 126/78 (BP Location: Left Arm, Patient Position: Sitting, Cuff Size: Normal)    Pulse 84    Temp (!) 97.3 F (36.3 C)    Ht 6\' 1"  (1.854 m)    Wt 187 lb 6.4 oz (85 kg)    SpO2 97%    BMI 24.72 kg/m  , BMI Body mass index is 24.72 kg/m. GEN: Well nourished, well developed, in no acute distress. HEENT: normal. Neck: Supple, no JVD, carotid bruits, or masses. Cardiac: RRR, no murmurs, rubs, or gallops. No clubbing, cyanosis, edema.  Radials/DP/PT 2+ and equal bilaterally.  Respiratory:  Respirations regular and unlabored, clear to auscultation bilaterally. GI: Soft, nontender, nondistended, BS + x 4. MS: no deformity or atrophy. Skin: warm and dry, no rash. Neuro:  Strength and sensation are intact. Psych: Normal affect.  Accessory Clinical Findings    ECG personally reviewed by me today-no EKG today.  Echocardiogram 08/13/2018:    1. The left ventricle has low normal systolic function, with an ejection fraction of 50-55%. The cavity size was normal. There is mildly increased left ventricular wall thickness. Left ventricular diastolic Doppler parameters are consistent with  impaired relaxation. No evidence of left ventricular regional wall  motion abnormalities.  2. The right ventricle has normal systolic function. The cavity was normal. There is no increase in right ventricular wall thickness.  3. There is mild mitral annular calcification present.  4. The aortic valve is tricuspid. Severely thickening of the aortic valve. Severe calcifcation of the aortic valve. Aortic valve regurgitation was not assessed by color flow Doppler.  5. The interatrial septum appears to be lipomatous.  Myocardial perfusion study 08/13/2018  IMPRESSION: 1. No reversible ischemia or infarction.  2. Slight generalized hypokinesia.  3. Left ventricular ejection fraction 45%  4. Non invasive risk stratification*: Intermediate based on mildly diminished ejection fraction  Assessment & Plan   1.  Coronary artery disease-no chest pain today Continue amlodipine 5 mg tablet daily Continue aspirin 81 mg tablet daily Continue metoprolol succinate 12.5 mg tablet daily Continue nitroglycerin 0.4 mg tablet sublingual as needed Continue physical activity as tolerated Heart healthy low-sodium diet  2.  Essential hypertension- well-controlled today 120's/70'2 at home.  Patient keeps track of his blood pressure at home 1 to 2 hours after blood pressure medication and he brought his log today. Continue amlodipine 5 mg tablet daily Continue metoprolol succinate 12.5 mg tablet daily Keep blood pressure log at home and bring to next office visit.  3.  Hyperlipidemia-08/13/2018: Cholesterol 162; HDL 34; LDL Cholesterol 99; Triglycerides 143; VLDL 29 Statin intolerant-myalgia Continue high-fiber/heart healthy/low-sodium diet Continue to increase physical activity as tolerated.     Disposition: Follow-up with Dr. Martinique in 6 months  Deberah Pelton, NP 09/03/2018, 9:59 AM

## 2018-10-29 ENCOUNTER — Other Ambulatory Visit: Payer: Self-pay | Admitting: Cardiology

## 2018-12-02 ENCOUNTER — Telehealth: Payer: Self-pay | Admitting: Physician Assistant

## 2018-12-02 NOTE — Telephone Encounter (Signed)
*  STAT* If patient is at the pharmacy, call can be transferred to refill team.   1. Which medications need to be refilled? (please list name of each medication and dose if known) amLODipine (NORVASC) 5 MG tablet  2. Which pharmacy/location (including street and city if local pharmacy) is medication to be sent to? [id number] - [place], [place] - [address] AT [address]  3. Do they need a 30 day or 90 day supply? 90  

## 2018-12-03 ENCOUNTER — Other Ambulatory Visit: Payer: Self-pay

## 2018-12-03 MED ORDER — AMLODIPINE BESYLATE 5 MG PO TABS: 5.0000 mg | ORAL_TABLET | Freq: Every day | ORAL | 3 refills | Status: DC

## 2019-03-22 ENCOUNTER — Ambulatory Visit: Payer: Medicare Other | Attending: Internal Medicine

## 2019-03-22 ENCOUNTER — Other Ambulatory Visit: Payer: Self-pay | Admitting: Physician Assistant

## 2019-03-22 DIAGNOSIS — Z23 Encounter for immunization: Secondary | ICD-10-CM | POA: Insufficient documentation

## 2019-03-22 NOTE — Progress Notes (Signed)
   Covid-19 Vaccination Clinic  Name:  Devon [name]    MRN: [id number] DOB: 29-Dec-1940  03/22/2019  Mr. [name] was observed post Covid-19 immunization for 15 minutes without incidence. He was provided with Vaccine Information Sheet and instruction to access the V-Safe system.   Mr. [name] was instructed to call 911 with any severe reactions post vaccine: Marland Kitchen Difficulty breathing  . Swelling of your face and throat  . A fast heartbeat  . A bad rash all over your body  . Dizziness and weakness    Immunizations Administered    Name Date Dose VIS Date Route   Pfizer COVID-19 Vaccine 03/22/2019 12:08 PM 0.3 mL 02/14/2019 Intramuscular   Manufacturer: [employer], [employer]   Lot: [id number]   NDC: [address]-4

## 2019-03-27 ENCOUNTER — Other Ambulatory Visit: Payer: Self-pay | Admitting: Cardiology

## 2019-04-11 ENCOUNTER — Ambulatory Visit: Payer: Medicare Other | Attending: Internal Medicine

## 2019-04-11 DIAGNOSIS — Z23 Encounter for immunization: Secondary | ICD-10-CM | POA: Insufficient documentation

## 2019-04-11 NOTE — Progress Notes (Signed)
   Covid-19 Vaccination Clinic  Name:  Devon [name]    MRN: [id number] DOB: 1940-08-12  04/11/2019  Mr. [name] was observed post Covid-19 immunization for 15 minutes without incidence. He was provided with Vaccine Information Sheet and instruction to access the V-Safe system.   Mr. [name] was instructed to call 911 with any severe reactions post vaccine: Marland Kitchen Difficulty breathing  . Swelling of your face and throat  . A fast heartbeat  . A bad rash all over your body  . Dizziness and weakness    Immunizations Administered    Name Date Dose VIS Date Route   Pfizer COVID-19 Vaccine 04/11/2019  8:45 AM 0.3 mL 02/14/2019 Intramuscular   Manufacturer: [employer], [employer]   Lot: [id number]   NDC: [address]-5

## 2019-06-22 ENCOUNTER — Other Ambulatory Visit: Payer: Self-pay | Admitting: Cardiology

## 2019-07-13 ENCOUNTER — Other Ambulatory Visit: Payer: Self-pay | Admitting: Cardiology

## 2019-07-18 ENCOUNTER — Other Ambulatory Visit: Payer: Self-pay

## 2019-09-18 ENCOUNTER — Other Ambulatory Visit: Payer: Self-pay | Admitting: Cardiology

## 2019-12-15 ENCOUNTER — Other Ambulatory Visit: Payer: Self-pay | Admitting: Cardiology

## 2019-12-17 NOTE — Telephone Encounter (Signed)
Rx request sent to pharmacy.  

## 2020-01-15 ENCOUNTER — Other Ambulatory Visit: Payer: Self-pay | Admitting: Cardiology

## 2020-02-11 ENCOUNTER — Other Ambulatory Visit: Payer: Self-pay | Admitting: Cardiology

## 2020-03-09 ENCOUNTER — Other Ambulatory Visit: Payer: Self-pay | Admitting: Cardiology

## 2023-02-20 ENCOUNTER — Other Ambulatory Visit (HOSPITAL_COMMUNITY): Payer: Self-pay | Admitting: Internal Medicine

## 2023-02-20 DIAGNOSIS — I2510 Atherosclerotic heart disease of native coronary artery without angina pectoris: Secondary | ICD-10-CM

## 2023-04-02 ENCOUNTER — Encounter: Payer: Self-pay | Admitting: *Deleted

## 2023-04-03 ENCOUNTER — Encounter: Payer: Self-pay | Admitting: Diagnostic Neuroimaging

## 2023-04-03 ENCOUNTER — Ambulatory Visit: Payer: Medicare Other | Admitting: Diagnostic Neuroimaging

## 2023-04-03 VITALS — BP 117/73 | HR 88 | Ht 74.0 in | Wt 175.4 lb

## 2023-04-03 DIAGNOSIS — R413 Other amnesia: Secondary | ICD-10-CM

## 2023-04-03 DIAGNOSIS — R269 Unspecified abnormalities of gait and mobility: Secondary | ICD-10-CM | POA: Diagnosis not present

## 2023-04-03 NOTE — Patient Instructions (Addendum)
  MILD MEMORY LOSS / GAIT DIFFICULTY (factors include: chronic alcohol use now improved; decreased activity, depression; could have onset of mild dementia) - check ATN panel - start memantine 10mg  at bedtime; increase to twice a day after 1-2 weeks - try to stay active physically and get some exercise (at least 15-30 minutes per day); consider [employer], physical therapy or Senior resources of [employer] exercise classes - eat a nutritious diet with lean protein, plants / vegetables, whole grains; avoid ultra-processed foods - increase social activities, brain stimulation, games, puzzles, hobbies, crafts, arts, music; try new activities; keep it fun! - aim for at least 7-8 hours sleep per night (or more) - avoid smoking and alcohol - caution with medications, finances, driving - safety / supervision issues reviewed - caregiver resources provided (including [email or web address])

## 2023-04-03 NOTE — Progress Notes (Signed)
[facility]  PATIENT: Devon [name] DOB: [birth year]  REFERRING CLINICIAN: Geoffry Paradise, MD HISTORY FROM: patient  REASON FOR VISIT: new consult   HISTORICAL  CHIEF COMPLAINT:  Chief Complaint  Patient presents with   New Patient (Initial Visit)    Pt in room 6. Daughter Tresa Endo in room. New patient here for Memory loss and gait instability. Daughter said short term is decreased, pt was drinking bourbon, memory has gotten better since no longer drinking. Short term is the concern, gait has gotten better since no longer drinking.  Last fall was last year at home.  MRI disk in room.    HISTORY OF PRESENT ILLNESS:   83 year old male here for evaluation of memory and gait difficulty.  Patient lives alone.  His wife passed away about age 75 years ago.  He has been maintaining his ADLs for the most part but family has noted some mild changes in terms of memory and gait over the last few years.  In 2021 he was evaluated for memory loss which was felt to be mild, but apparently dementia was ruled out at that time.  Since that time he is also been steadily drinking increasing amounts of alcohol on a daily basis, mainly related to boredom and depression.  He was getting the point of having significant memory and cognitive issues, gait and balance difficulties.  He had ultrasound of the abdomen which demonstrated fatty liver and therefore this was brought to his attention.  Fortunately patient has essentially cut out drinking alcohol.  Some of his symptoms have improved.  Daughter lives in [place].  Son lives in [place].  According to daughter his nutrition is suboptimal.  He likes to walk near his neighborhood and want about discharge.  He was previously active in terms of hobbies, exercise and activities, but unfortunately many of his friends have passed away or moved away.    REVIEW OF SYSTEMS: Full 14 system review of systems  performed and negative with exception of: as per HPI.  ALLERGIES: Allergies  Allergen Reactions   Metoprolol Other (See Comments)    Chest pressure   Statins Other (See Comments)    Myalgias   Zetia [Ezetimibe] Other (See Comments)    Myalgias    HOME MEDICATIONS: Outpatient Medications Prior to Visit  Medication Sig Dispense Refill   allopurinol (ZYLOPRIM) 100 MG tablet Take 100 mg by mouth 2 (two) times daily.      amLODipine (NORVASC) 5 MG tablet TAKE 1 TABLET(5 MG) BY MOUTH DAILY 30 tablet 3   aspirin EC 81 MG tablet Take 81 mg by mouth daily.     benazepril (LOTENSIN) 10 MG tablet Take 10 mg by mouth daily.     desonide (DESOWEN) 0.05 % cream Apply 1 application topically daily as needed (for spots).     fish oil-omega-3 fatty acids 1000 MG capsule Take 1 g by mouth daily.     nitroGLYCERIN (NITROSTAT) 0.4 MG SL tablet Place 1 tablet (0.4 mg total) under the tongue every 5 (five) minutes as needed for chest pain. 25 tablet 3   metoprolol succinate (TOPROL-XL) 25 MG 24 hr tablet TAKE 1/2 TABLET(12.5 MG) BY MOUTH DAILY (Patient not taking: Reported on 04/03/2023) 15 tablet 0   No facility-administered medications prior to visit.    PAST MEDICAL HISTORY: Past Medical History:  Diagnosis Date   Bilateral plantar fasciitis    Coronary artery disease    a. s/p CABG 1991 with LIMA  to LAD. b. s/p DES to mid LCX 06/2011. c. Cath 10/2013: severe disease in LAD with competitive flow distal to this from LIMA graft. EF 55-60%. Continued med rx.   H/O: gout    Hyperlipidemia    statin intolerant; intolerant to zetia   Hypertension    Thrombocytopenia (HCC)     PAST SURGICAL HISTORY: Past Surgical History:  Procedure Laterality Date   CARDIAC CATHETERIZATION  April 2013   DES to mid LCX; EF of 50%.    CORONARY ARTERY BYPASS GRAFT  1991   severe 90% ostial LAD lesion with LIMA to LAD   LEFT HEART CATHETERIZATION WITH CORONARY ANGIOGRAM N/A 10/21/2013   Procedure: LEFT HEART  CATHETERIZATION WITH CORONARY ANGIOGRAM;  Surgeon: Peter M Swaziland, MD;  Location: [facility] CATH LAB;  Service: Cardiovascular;  Laterality: N/A;   LEFT HEART CATHETERIZATION WITH CORONARY/GRAFT ANGIOGRAM N/A 07/03/2011   Procedure: LEFT HEART CATHETERIZATION WITH Isabel Caprice;  Surgeon: Herby Abraham, MD;  Location: [facility] CATH LAB;  Service: Cardiovascular;  Laterality: N/A;    FAMILY HISTORY: Family History  Problem Relation Age of Onset   Alzheimer's disease Mother    Healthy Sister     SOCIAL HISTORY: Social History   Socioeconomic History   Marital status: Married    Spouse name: Not on file   Number of children: 2   Years of education: Not on file   Highest education level: Not on file  Occupational History   Not on file  Tobacco Use   Smoking status: Never   Smokeless tobacco: Never  Vaping Use   Vaping status: Never Used  Substance and Sexual Activity   Alcohol use: Not Currently   Drug use: No   Sexual activity: Not Currently  Other Topics Concern   Not on file  Social History Narrative   Patient lives alone, wife passed 15 years ago   Right handed    Wear glasses    Drinks 1 cup per day   Drinks coke sometimes.    Social Drivers of Corporate investment banker Strain: Not on file  Food Insecurity: Not on file  Transportation Needs: Not on file  Physical Activity: Not on file  Stress: Not on file  Social Connections: Not on file  Intimate Partner Violence: Not on file     PHYSICAL EXAM  GENERAL EXAM/CONSTITUTIONAL: Vitals:  Vitals:   04/03/23 1151  BP: 117/73  Pulse: 88  Weight: 175 lb 6.4 oz (79.6 kg)  Height: 6\' 2"  (1.88 m)   Body mass index is 22.52 kg/m. Wt Readings from Last 3 Encounters:  04/03/23 175 lb 6.4 oz (79.6 kg)  09/03/18 187 lb 6.4 oz (85 kg)  08/13/18 184 lb 1.4 oz (83.5 kg)   Patient is in no distress; well developed, nourished and groomed; neck is supple  CARDIOVASCULAR: Examination of carotid arteries is normal;  no carotid bruits Regular rate and rhythm, no murmurs Examination of peripheral vascular system by observation and palpation is normal  EYES: Ophthalmoscopic exam of optic discs and posterior segments is normal; no papilledema or hemorrhages No results found.  MUSCULOSKELETAL: Gait, strength, tone, movements noted in Neurologic exam below  NEUROLOGIC: MENTAL STATUS:      No data to display            04/03/2023   11:59 AM  Montreal Cognitive Assessment   Visuospatial/ Executive (0/5) 4  Naming (0/3) 3  Attention: Read list of digits (0/2) 2  Attention: Read list of letters (  0/1) 1  Attention: Serial 7 subtraction starting at 100 (0/3) 1  Language: Repeat phrase (0/2) 1  Language : Fluency (0/1) 0  Abstraction (0/2) 2  Delayed Recall (0/5) 1  Orientation (0/6) 2  Total 17   awake, alert, oriented to person, place and time recent and remote memory intact normal attention and concentration language fluent, comprehension intact, naming intact fund of knowledge appropriate  CRANIAL NERVE:  2nd - no papilledema on fundoscopic exam 2nd, 3rd, 4th, 6th - pupils equal and reactive to light, visual fields full to confrontation, extraocular muscles intact, no nystagmus 5th - facial sensation symmetric 7th - facial strength symmetric 8th - hearing intact 9th - palate elevates symmetrically, uvula midline 11th - shoulder shrug symmetric 12th - tongue protrusion midline  MOTOR:  normal bulk and tone, full strength in the BUE, BLE  SENSORY:  normal and symmetric to light touch, temperature, vibration  COORDINATION:  finger-nose-finger, fine finger movements normal  REFLEXES:  deep tendon reflexes 1+ and symmetric  GAIT/STATION:  narrow based gait; caution with turning.  Difficulty with toe, heel and tandem gait.  Romberg is unsteady.       DIAGNOSTIC DATA (LABS, IMAGING, TESTING) - I reviewed patient records, labs, notes, testing and imaging myself where  available.  Lab Results  Component Value Date   WBC 8.7 08/12/2018   HGB 17.0 08/12/2018   HCT 48.8 08/12/2018   MCV 90.5 08/12/2018   PLT 142 (L) 08/12/2018      Component Value Date/Time   NA 139 09/02/2018 0842   K 4.2 09/02/2018 0842   CL 100 09/02/2018 0842   CO2 23 09/02/2018 0842   GLUCOSE 92 09/02/2018 0842   GLUCOSE 93 08/13/2018 0314   BUN 14 09/02/2018 0842   CREATININE 1.22 09/02/2018 0842   CALCIUM 9.6 09/02/2018 0842   PROT 6.7 08/12/2018 1549   ALBUMIN 4.1 08/12/2018 1549   AST 34 08/12/2018 1549   ALT 36 08/12/2018 1549   ALKPHOS 87 08/12/2018 1549   BILITOT 0.7 08/12/2018 1549   GFRNONAA 56 (L) 09/02/2018 0842   GFRAA 64 09/02/2018 0842   Lab Results  Component Value Date   CHOL 162 08/13/2018   HDL 34 (L) 08/13/2018   LDLCALC 99 08/13/2018   LDLDIRECT 107.7 07/17/2012   TRIG 143 08/13/2018   CHOLHDL 4.8 08/13/2018   Lab Results  Component Value Date   HGBA1C 5.0 07/03/2011   No results found for: "VITAMINB12" No results found for: "TSH"  02/13/23 MRI brain  1.  No acute intracranial abnormality.  2.  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  3.  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  4.  Incompletely evaluated osseous lesion in the C4 vertebral body. Dedicated MRI of the cervical spine with and without contrast is recommended for further evaluation and characterization.   04/03/23 MRI cervical spine [I reviewed images and my interpretation is below;  official report not available] -C4 hemangioma -Moderate C3-4 spinal stenosis  -Mild-moderate C4-5 spinal stenosis    ASSESSMENT AND PLAN  83 y.o. year old male here with:   Dx:  1. Memory loss     PLAN:  MILD MEMORY LOSS / GAIT DIFFICULTY (factors include: chronic alcohol use now improved; decreased activity, depression; could have onset of mild dementia) - check ATN panel - start memantine 10mg  at bedtime; increase to twice a day  after 1-2 weeks - try to stay active physically and get some exercise (at  least 15-30 minutes per day); consider [employer], physical therapy or Senior resources of [employer] exercise classes - eat a nutritious diet with lean protein, plants / vegetables, whole grains; avoid ultra-processed foods - increase social activities, brain stimulation, games, puzzles, hobbies, crafts, arts, music; try new activities; keep it fun! - aim for at least 7-8 hours sleep per night (or more) - avoid smoking and alcohol - caution with medications, finances, driving - safety / supervision issues reviewed - caregiver resources provided (including [email or web address])  Orders Placed This Encounter  Procedures   ATN PROFILE   Return for pending if symptoms worsen or fail to improve, pending test results.  I spent 60 minutes of face-to-face and non-face-to-face time with patient.  This included previsit chart review, lab review, study review, order entry, electronic health record documentation, patient education.     Suanne Marker, MD 04/03/2023, 1:05 PM Certified in Neurology, Neurophysiology and Neuroimaging  [facility] [address], [address] [place], [place] [address] [phone]

## 2023-04-06 LAB — ATN PROFILE
A -- Beta-amyloid 42/40 Ratio: 0.105 (ref >0.102)
Beta-amyloid 40: 258.82 pg/mL
Beta-amyloid 42: 27.17 pg/mL
N -- NfL, Plasma: 4.11 pg/mL (ref 0.00–11.55)
T -- p-tau181: 1.49 pg/mL — ABNORMAL HIGH (ref 0.00–0.97)
